# Patient Record
Sex: Female | Born: 1970 | Race: White | Hispanic: No | Marital: Married | Smoking: Former smoker
Health system: Southern US, Community
[De-identification: ages and names within clinical notes are randomized; demographics above are authoritative.]

## PROBLEM LIST (undated history)

## (undated) DIAGNOSIS — F988 Other specified behavioral and emotional disorders with onset usually occurring in childhood and adolescence: Secondary | ICD-10-CM

## (undated) DIAGNOSIS — F329 Major depressive disorder, single episode, unspecified: Secondary | ICD-10-CM

## (undated) DIAGNOSIS — F32A Depression, unspecified: Secondary | ICD-10-CM

## (undated) DIAGNOSIS — J45909 Unspecified asthma, uncomplicated: Secondary | ICD-10-CM

## (undated) DIAGNOSIS — Z8759 Personal history of other complications of pregnancy, childbirth and the puerperium: Secondary | ICD-10-CM

## (undated) DIAGNOSIS — K648 Other hemorrhoids: Secondary | ICD-10-CM

## (undated) HISTORY — PX: FOOT SURGERY: SHX648

---

## 2000-01-20 ENCOUNTER — Other Ambulatory Visit: Admission: RE | Admit: 2000-01-20 | Discharge: 2000-01-20 | Payer: Self-pay | Admitting: Obstetrics and Gynecology

## 2002-10-28 ENCOUNTER — Other Ambulatory Visit: Admission: RE | Admit: 2002-10-28 | Discharge: 2002-10-28 | Payer: Self-pay | Admitting: Obstetrics & Gynecology

## 2003-02-03 ENCOUNTER — Encounter: Payer: Self-pay | Admitting: Obstetrics and Gynecology

## 2003-02-03 ENCOUNTER — Inpatient Hospital Stay (HOSPITAL_COMMUNITY): Admission: AD | Admit: 2003-02-03 | Discharge: 2003-02-06 | Payer: Self-pay | Admitting: Obstetrics & Gynecology

## 2003-02-04 ENCOUNTER — Encounter: Payer: Self-pay | Admitting: Obstetrics and Gynecology

## 2003-02-07 ENCOUNTER — Encounter: Payer: Self-pay | Admitting: Emergency Medicine

## 2003-02-07 ENCOUNTER — Encounter (INDEPENDENT_AMBULATORY_CARE_PROVIDER_SITE_OTHER): Payer: Self-pay | Admitting: Specialist

## 2003-02-07 ENCOUNTER — Encounter: Payer: Self-pay | Admitting: Obstetrics and Gynecology

## 2003-02-07 ENCOUNTER — Inpatient Hospital Stay (HOSPITAL_COMMUNITY): Admission: AD | Admit: 2003-02-07 | Discharge: 2003-02-12 | Payer: Self-pay | Admitting: Obstetrics and Gynecology

## 2003-02-08 ENCOUNTER — Encounter: Payer: Self-pay | Admitting: Obstetrics and Gynecology

## 2003-02-09 ENCOUNTER — Encounter: Payer: Self-pay | Admitting: Obstetrics and Gynecology

## 2003-02-15 ENCOUNTER — Inpatient Hospital Stay (HOSPITAL_COMMUNITY): Admission: AD | Admit: 2003-02-15 | Discharge: 2003-02-15 | Payer: Self-pay | Admitting: Obstetrics and Gynecology

## 2003-06-21 ENCOUNTER — Inpatient Hospital Stay (HOSPITAL_COMMUNITY): Admission: EM | Admit: 2003-06-21 | Discharge: 2003-06-24 | Payer: Self-pay | Admitting: *Deleted

## 2003-06-25 ENCOUNTER — Other Ambulatory Visit (HOSPITAL_COMMUNITY): Admission: RE | Admit: 2003-06-25 | Discharge: 2003-06-27 | Payer: Self-pay | Admitting: Psychiatry

## 2003-10-03 ENCOUNTER — Other Ambulatory Visit: Admission: RE | Admit: 2003-10-03 | Discharge: 2003-10-03 | Payer: Self-pay | Admitting: Obstetrics and Gynecology

## 2004-02-13 ENCOUNTER — Inpatient Hospital Stay (HOSPITAL_COMMUNITY): Admission: AD | Admit: 2004-02-13 | Discharge: 2004-02-13 | Payer: Self-pay | Admitting: Obstetrics and Gynecology

## 2004-03-09 ENCOUNTER — Ambulatory Visit (HOSPITAL_COMMUNITY): Admission: RE | Admit: 2004-03-09 | Discharge: 2004-03-09 | Payer: Self-pay | Admitting: Obstetrics and Gynecology

## 2004-03-09 ENCOUNTER — Ambulatory Visit: Payer: Self-pay | Admitting: *Deleted

## 2004-03-26 ENCOUNTER — Ambulatory Visit (HOSPITAL_COMMUNITY): Admission: RE | Admit: 2004-03-26 | Discharge: 2004-03-26 | Payer: Self-pay | Admitting: Obstetrics and Gynecology

## 2004-04-09 ENCOUNTER — Ambulatory Visit (HOSPITAL_COMMUNITY): Admission: RE | Admit: 2004-04-09 | Discharge: 2004-04-09 | Payer: Self-pay | Admitting: *Deleted

## 2004-04-10 ENCOUNTER — Inpatient Hospital Stay (HOSPITAL_COMMUNITY): Admission: AD | Admit: 2004-04-10 | Discharge: 2004-04-10 | Payer: Self-pay | Admitting: Obstetrics and Gynecology

## 2004-04-13 ENCOUNTER — Ambulatory Visit: Payer: Self-pay | Admitting: *Deleted

## 2004-04-13 ENCOUNTER — Ambulatory Visit (HOSPITAL_COMMUNITY): Admission: RE | Admit: 2004-04-13 | Discharge: 2004-04-13 | Payer: Self-pay | Admitting: *Deleted

## 2004-04-20 ENCOUNTER — Ambulatory Visit: Payer: Self-pay | Admitting: Obstetrics & Gynecology

## 2004-04-20 ENCOUNTER — Ambulatory Visit (HOSPITAL_COMMUNITY): Admission: RE | Admit: 2004-04-20 | Discharge: 2004-04-20 | Payer: Self-pay | Admitting: *Deleted

## 2004-04-27 ENCOUNTER — Ambulatory Visit: Payer: Self-pay | Admitting: Family Medicine

## 2004-04-27 ENCOUNTER — Ambulatory Visit: Payer: Self-pay | Admitting: *Deleted

## 2004-04-27 ENCOUNTER — Ambulatory Visit (HOSPITAL_COMMUNITY): Admission: RE | Admit: 2004-04-27 | Discharge: 2004-04-27 | Payer: Self-pay | Admitting: *Deleted

## 2004-04-29 ENCOUNTER — Inpatient Hospital Stay (HOSPITAL_COMMUNITY): Admission: AD | Admit: 2004-04-29 | Discharge: 2004-05-04 | Payer: Self-pay | Admitting: Obstetrics and Gynecology

## 2004-04-30 ENCOUNTER — Encounter (INDEPENDENT_AMBULATORY_CARE_PROVIDER_SITE_OTHER): Payer: Self-pay | Admitting: *Deleted

## 2004-05-05 ENCOUNTER — Encounter: Admission: RE | Admit: 2004-05-05 | Discharge: 2004-06-04 | Payer: Self-pay | Admitting: Obstetrics and Gynecology

## 2004-07-05 ENCOUNTER — Encounter: Admission: RE | Admit: 2004-07-05 | Discharge: 2004-08-03 | Payer: Self-pay | Admitting: Obstetrics and Gynecology

## 2004-10-11 ENCOUNTER — Other Ambulatory Visit: Admission: RE | Admit: 2004-10-11 | Discharge: 2004-10-11 | Payer: Self-pay | Admitting: Obstetrics and Gynecology

## 2005-02-23 IMAGING — US US OB FOLLOW-UP
1 series · 18 of 28 positions shown · non-contrast
Comparison: none

CLINICAL DATA: 34 week 2 day assigned gestational age. Maternal hypertension. 
Previous history of preeclampsia, abruption, and pregnancy loss. Evaluate fetal
growth, amniotic fluid, biophysical profile and fetal Dopplers.

[Series 1: us ob re-eval · 18 of 53 slices shown]
[im 1/53]
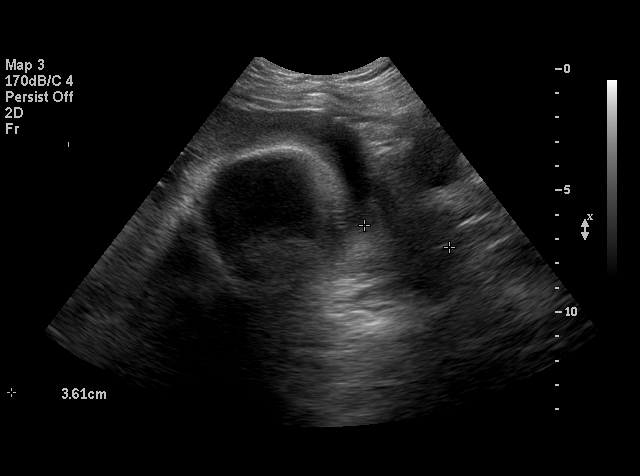
[im 4/53]
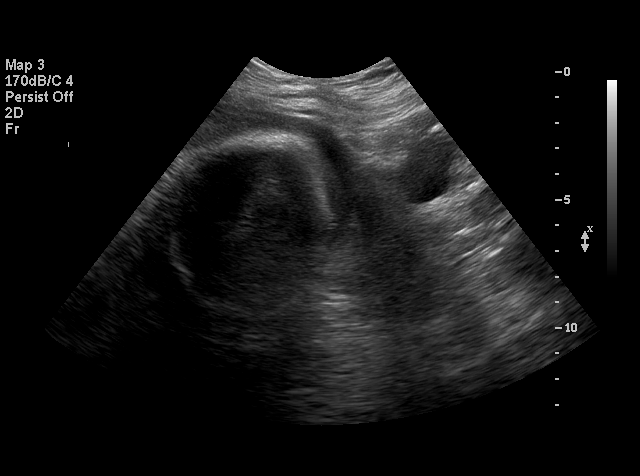
[im 6/53]
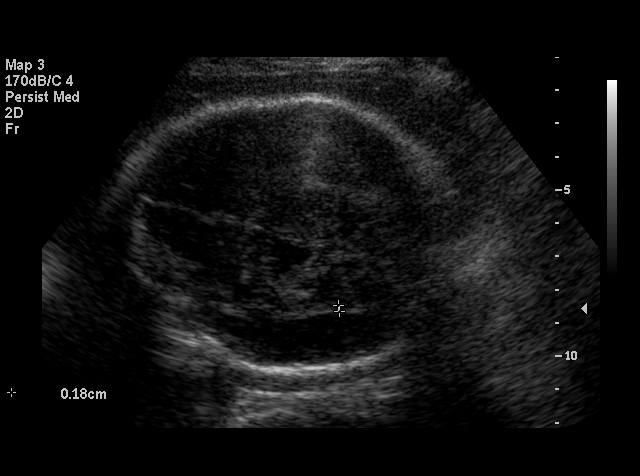
[im 10/53]
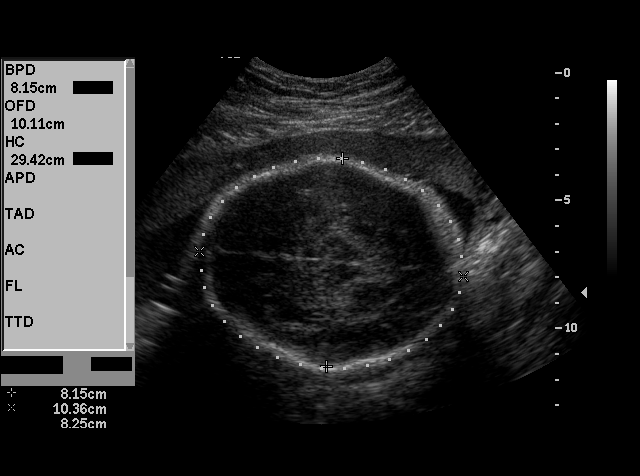
[im 14/53]
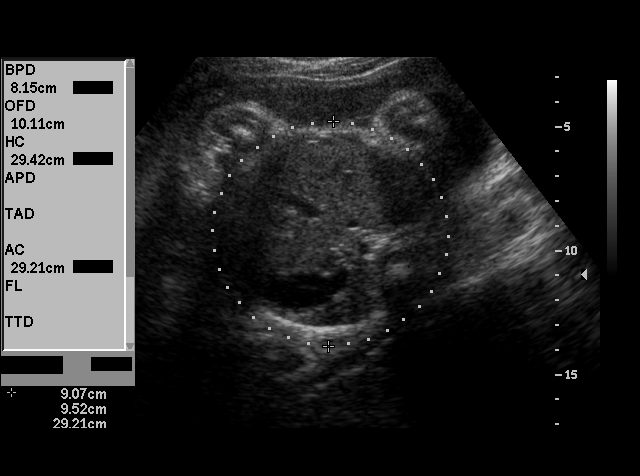
[im 16/53]
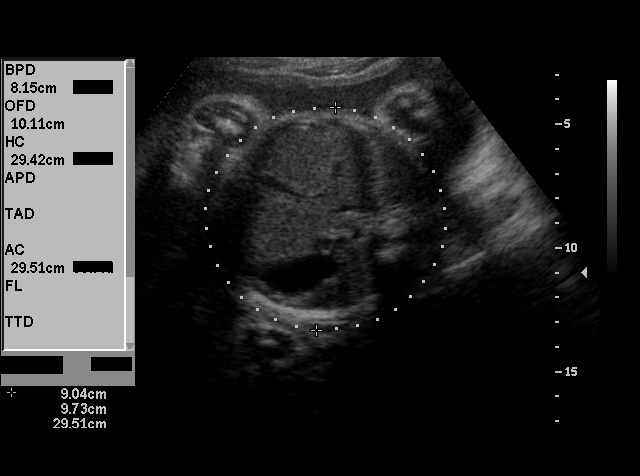
[im 20/53]
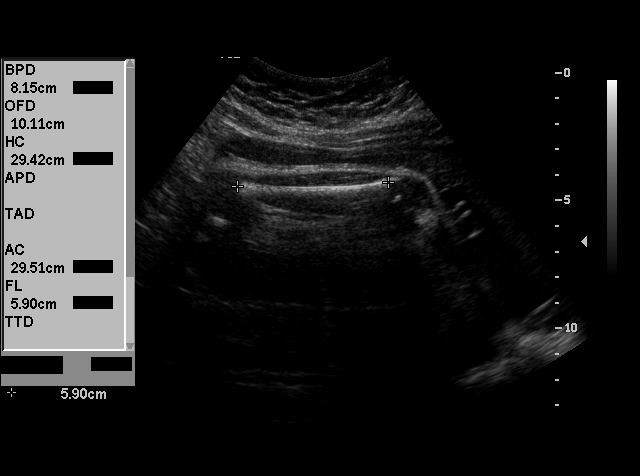
[im 22/53]
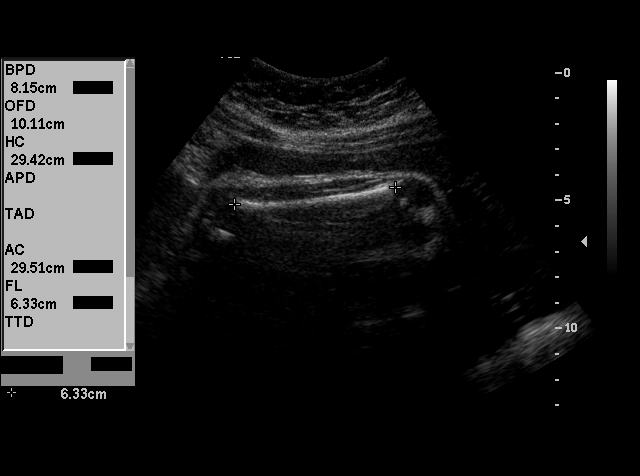
[im 26/53]
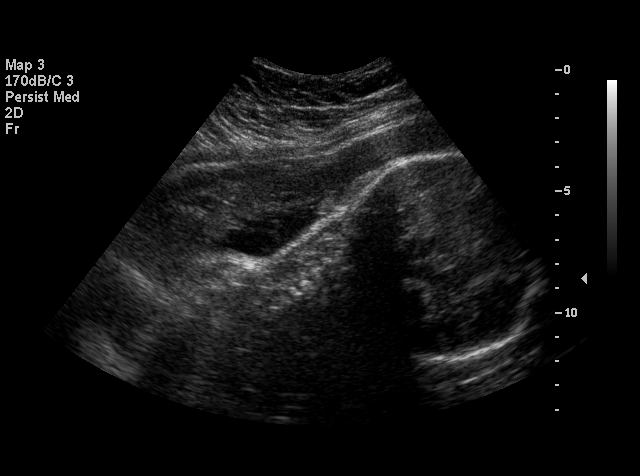
[im 27/53]
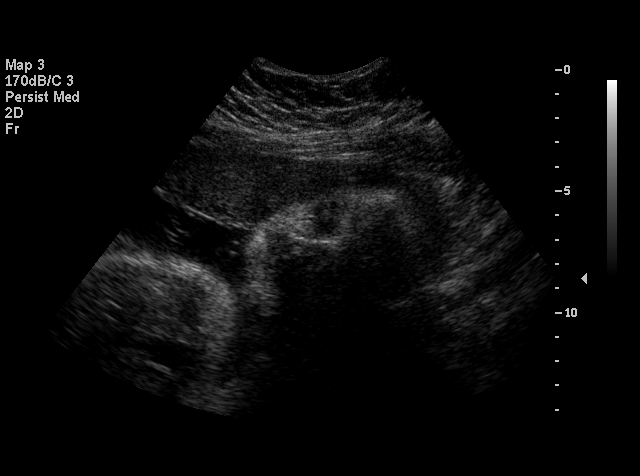
[im 31/53]
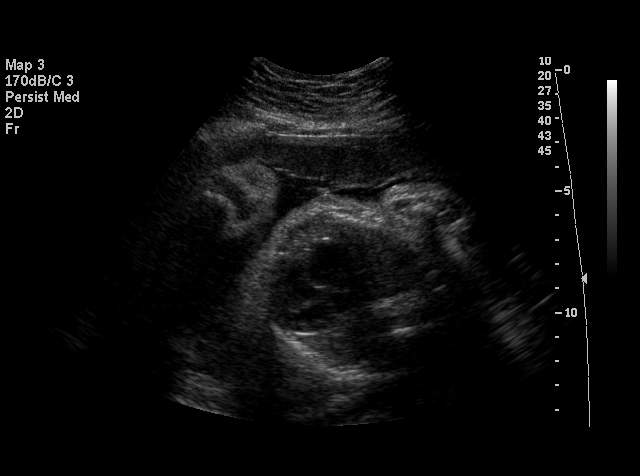
[im 33/53]
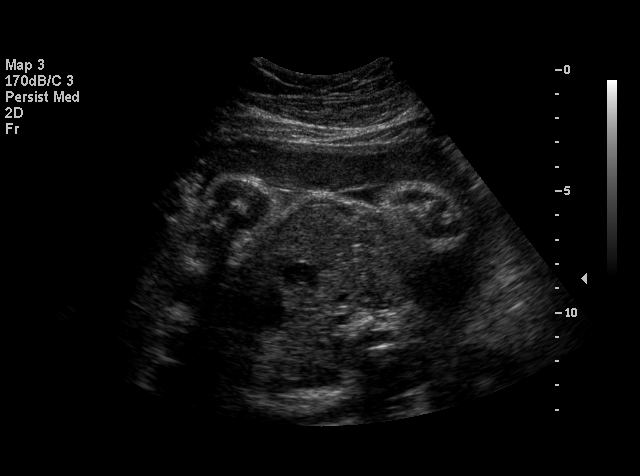
[im 37/53]
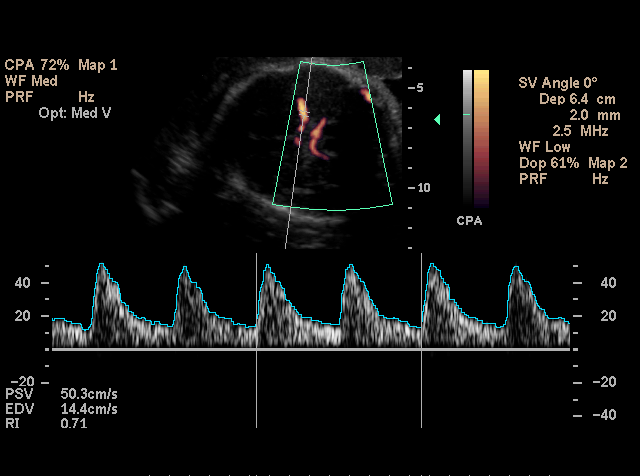
[im 41/53]
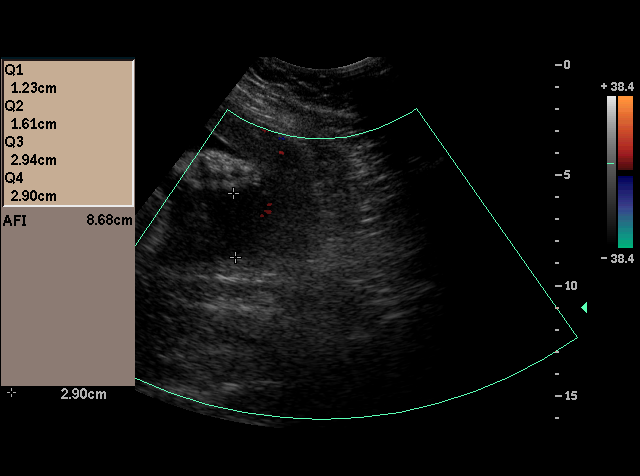
[im 43/53]
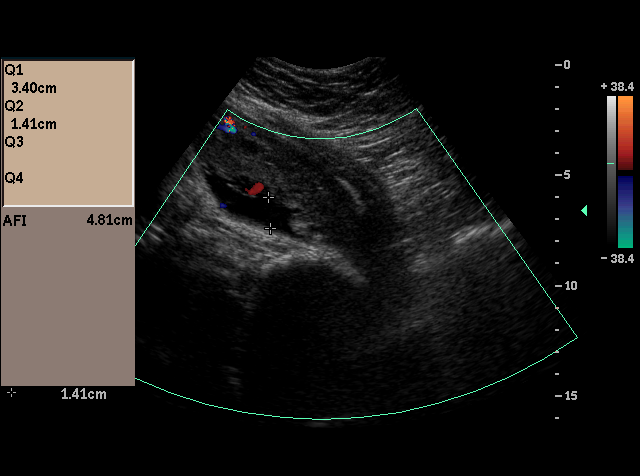
[im 47/53]
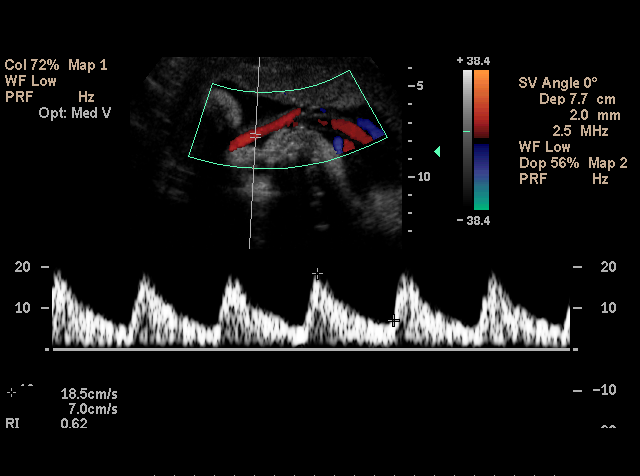
[im 49/53]
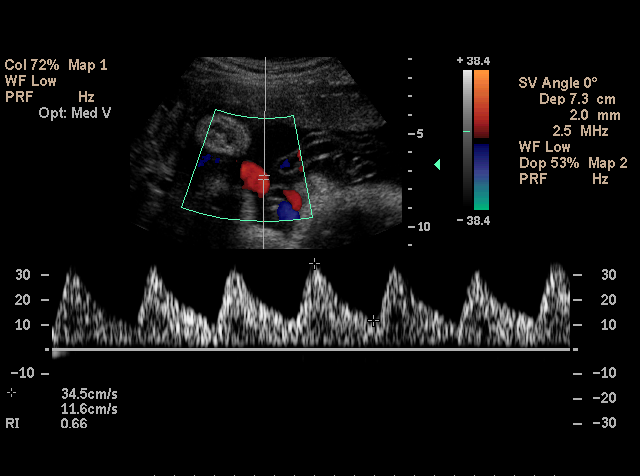
[im 53/53]
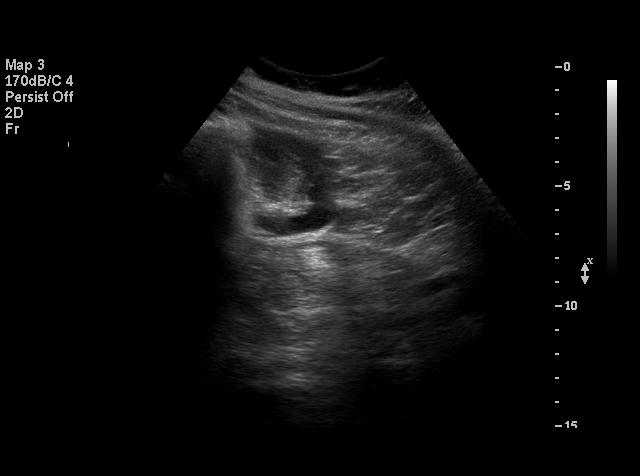

[18 of 28 positions shown; findings below may reference images not displayed]

OBSTETRICAL ULTRASOUND RE-EVALUATION:

Number of fetuses: 1
Heart rate: 136
Movement: Yes
Breathing: Yes
Presentation: Cephalic
Placental Location: Anterior, left lateral 
Placental Grade: II
Placenta Previa: No
Amniotic Fluid (Subjective): Normal
Amniotic Fluid (Objective): 10.4 cm AFI (5th - 95th%ile = 8.1 - 24.8 cm for 34
wks)

BPD: 8.2 cm 33 w 0 d
HC: 29.7 cm 32 w 6 d
AC: 29.4 cm 33 w 3 d
FL: 6.1 cm 31 w 6 d
Mean GA: 32 w 6 d

EFW: 9051 g (H) 25th - 50th%ile (0825-2146g) for 07wks

FETAL ANATOMY
Lateral Ventricle: Visualized 
Thalamus/CSP: Visualized 
Posterior Fossa: Previously seen 
Nuchal Region: N/A
Spine: Previously seen 
4-Chamber Heart: Previously seen 
Stomach: Visualized 
3-Vessel Cord: Previously seen 
Abd Cord Insert: Not visualized 
Kidneys: Visualized 
Bladder: Visualized 
Extremities: Previously seen 

Additional Anatomy Visualized: Upper lip, profile, and diaphragm.

MATERNAL FINDINGS
Cervix: 3.3 cm Transabdominally
IMPRESSION: 1. Assigned gestational age is currently 34 weeks 2 days. Appropriate fetal
growth, with EFW at 25th - 50th percentile.

2. Normal amniotic fluid volume. 

BIOPHYSICAL PROFILE

Movement: 2 Time: 10 minutes
Tone: 2
Breathing: 2
Amniotic Fluid: 2

Total Score: 8

IMPRESSION

Biophysical profile score [DATE].

DOPPLER ULTRASOUND OF FETUS

Umbilical Artery S/D Ratio: 3.00 (NL < 3.57)

Middle Cerebral Artery PI: 1.49 (NL > 1.35)

IMPRESSION

Both umbilical artery and fetal middle cerebral artery Doppler measurements are
within normal limits.

## 2005-07-29 ENCOUNTER — Encounter: Admission: RE | Admit: 2005-07-29 | Discharge: 2005-07-29 | Payer: Self-pay | Admitting: Family Medicine

## 2005-09-23 ENCOUNTER — Ambulatory Visit (HOSPITAL_COMMUNITY): Admission: RE | Admit: 2005-09-23 | Discharge: 2005-09-23 | Payer: Self-pay | Admitting: Family Medicine

## 2005-11-27 ENCOUNTER — Emergency Department (HOSPITAL_COMMUNITY): Admission: EM | Admit: 2005-11-27 | Discharge: 2005-11-27 | Payer: Self-pay | Admitting: Emergency Medicine

## 2005-12-29 ENCOUNTER — Encounter: Admission: RE | Admit: 2005-12-29 | Discharge: 2005-12-29 | Payer: Self-pay | Admitting: General Surgery

## 2005-12-31 ENCOUNTER — Emergency Department (HOSPITAL_COMMUNITY): Admission: EM | Admit: 2005-12-31 | Discharge: 2005-12-31 | Payer: Self-pay | Admitting: Emergency Medicine

## 2006-01-02 ENCOUNTER — Ambulatory Visit (HOSPITAL_COMMUNITY): Admission: RE | Admit: 2006-01-02 | Discharge: 2006-01-03 | Payer: Self-pay | Admitting: Surgery

## 2006-01-02 ENCOUNTER — Encounter (INDEPENDENT_AMBULATORY_CARE_PROVIDER_SITE_OTHER): Payer: Self-pay | Admitting: *Deleted

## 2006-02-20 ENCOUNTER — Ambulatory Visit: Payer: Self-pay | Admitting: Gastroenterology

## 2006-03-23 ENCOUNTER — Ambulatory Visit: Payer: Self-pay | Admitting: Gastroenterology

## 2007-06-25 ENCOUNTER — Other Ambulatory Visit: Admission: RE | Admit: 2007-06-25 | Discharge: 2007-06-25 | Payer: Self-pay | Admitting: Family Medicine

## 2007-11-07 ENCOUNTER — Encounter: Admission: RE | Admit: 2007-11-07 | Discharge: 2007-11-07 | Payer: Self-pay | Admitting: Otolaryngology

## 2007-11-29 ENCOUNTER — Emergency Department (HOSPITAL_BASED_OUTPATIENT_CLINIC_OR_DEPARTMENT_OTHER): Admission: EM | Admit: 2007-11-29 | Discharge: 2007-11-29 | Payer: Self-pay | Admitting: Emergency Medicine

## 2007-12-13 ENCOUNTER — Emergency Department (HOSPITAL_COMMUNITY): Admission: EM | Admit: 2007-12-13 | Discharge: 2007-12-14 | Payer: Self-pay | Admitting: *Deleted

## 2007-12-14 ENCOUNTER — Emergency Department (HOSPITAL_COMMUNITY): Admission: EM | Admit: 2007-12-14 | Discharge: 2007-12-14 | Payer: Self-pay | Admitting: Emergency Medicine

## 2008-04-13 ENCOUNTER — Emergency Department (HOSPITAL_BASED_OUTPATIENT_CLINIC_OR_DEPARTMENT_OTHER): Admission: EM | Admit: 2008-04-13 | Discharge: 2008-04-14 | Payer: Self-pay | Admitting: Emergency Medicine

## 2008-04-27 ENCOUNTER — Emergency Department (HOSPITAL_BASED_OUTPATIENT_CLINIC_OR_DEPARTMENT_OTHER): Admission: EM | Admit: 2008-04-27 | Discharge: 2008-04-27 | Payer: Self-pay | Admitting: Emergency Medicine

## 2008-07-21 ENCOUNTER — Ambulatory Visit: Payer: Self-pay | Admitting: Radiology

## 2008-07-21 ENCOUNTER — Emergency Department (HOSPITAL_BASED_OUTPATIENT_CLINIC_OR_DEPARTMENT_OTHER): Admission: EM | Admit: 2008-07-21 | Discharge: 2008-07-21 | Payer: Self-pay | Admitting: Emergency Medicine

## 2008-11-12 ENCOUNTER — Other Ambulatory Visit: Admission: RE | Admit: 2008-11-12 | Discharge: 2008-11-12 | Payer: Self-pay | Admitting: Family Medicine

## 2009-07-07 ENCOUNTER — Emergency Department (HOSPITAL_COMMUNITY): Admission: EM | Admit: 2009-07-07 | Discharge: 2009-07-07 | Payer: Self-pay | Admitting: Emergency Medicine

## 2010-07-03 ENCOUNTER — Encounter: Payer: Self-pay | Admitting: *Deleted

## 2010-07-03 ENCOUNTER — Encounter: Payer: Self-pay | Admitting: Obstetrics and Gynecology

## 2010-08-29 LAB — URINE CULTURE
Colony Count: NO GROWTH
Culture: NO GROWTH

## 2010-08-29 LAB — RAPID URINE DRUG SCREEN, HOSP PERFORMED
Amphetamines: POSITIVE — AB
Barbiturates: NOT DETECTED
Opiates: POSITIVE — AB

## 2010-08-29 LAB — DIFFERENTIAL
Basophils Relative: 1 % (ref 0–1)
Eosinophils Absolute: 0.1 10*3/uL (ref 0.0–0.7)
Monocytes Absolute: 0.5 10*3/uL (ref 0.1–1.0)

## 2010-08-29 LAB — URINALYSIS, ROUTINE W REFLEX MICROSCOPIC
Glucose, UA: NEGATIVE mg/dL
Ketones, ur: 40 mg/dL — AB
Protein, ur: 100 mg/dL — AB
Specific Gravity, Urine: 1.039 — ABNORMAL HIGH (ref 1.005–1.030)
pH: 5.5 (ref 5.0–8.0)

## 2010-08-29 LAB — CK TOTAL AND CKMB (NOT AT ARMC): CK, MB: 7.8 ng/mL (ref 0.3–4.0)

## 2010-08-29 LAB — BASIC METABOLIC PANEL
BUN: 13 mg/dL (ref 6–23)
Calcium: 8.9 mg/dL (ref 8.4–10.5)
Chloride: 107 mEq/L (ref 96–112)
Creatinine, Ser: 0.87 mg/dL (ref 0.4–1.2)
GFR calc Af Amer: >60 mL/min (ref >60)
Glucose, Bld: 87 mg/dL (ref 70–99)
Potassium: 3 mEq/L — ABNORMAL LOW (ref 3.5–5.1)

## 2010-08-29 LAB — CBC
HCT: 39.3 % (ref 36.0–46.0)
Hemoglobin: 13.4 g/dL (ref 12.0–15.0)
MCV: 90.2 fL (ref 78.0–100.0)
RBC: 4.36 MIL/uL (ref 3.87–5.11)

## 2010-08-29 LAB — POCT CARDIAC MARKERS
CKMB, poc: 5.7 ng/mL (ref 1.0–8.0)
Troponin i, poc: <0.05 ng/mL (ref 0.00–0.09)

## 2010-08-29 LAB — URINE MICROSCOPIC-ADD ON

## 2010-08-29 LAB — SALICYLATE LEVEL: Salicylate Lvl: <4 mg/dL (ref 2.8–20.0)

## 2010-10-29 NOTE — Discharge Summary (Signed)
NAME:  Tracey Ward, Tracey Ward                  ACCOUNT NO.:  [id number]   MEDICAL RECORD NO.:  [id number]                   PATIENT TYPE:  INP   LOCATION:  9141                                 FACILITY:  [facility]   PHYSICIAN:  Randye Lobo, M.D.                DATE OF BIRTH:  1971/06/05   DATE OF ADMISSION:  02/07/2003  DATE OF DISCHARGE:  02/12/2003                                 DISCHARGE SUMMARY   ADMISSION DIAGNOSES:  1. Intrauterine fetal demise at 23+5 weeks.  2. Placental abruption.  3. Oligohydramnios.  4. Probable severe pre-eclampsia with acute pulmonary edema.  5. Tobacco use.  6. Anxiety and depression disorder.   DISCHARGE DIAGNOSES:  1. Probable severe pre-eclampsia with acute pulmonary edema, resolving.  2. Tobacco use.  3. 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  4. Status post hysterotomy.   PROCEDURE:  Hysterotomy with delivery of expired fetus, on February 07, 2003,  at the [facility], under the direction of Dr. Conley Simmonds.   HOSPITAL COURSE:  The patient was a 40 year old gravida 1, para 0, Caucasian  female who presented to [facility] at 23+[redacted] weeks gestation, on the  evening of February 07, 2003, with acute shortness of breath and vaginal  bleeding.  The patient had been recently discharged from the [facility]  where she was evaluated for pregnancy induced hypertension, a partial  placental abruption, oligohydramnios, and an upper respiratory infection  with asthma exacerbation.  Upon presentation to [facility], the  patient was cared for by the emergency department staff and was diagnosed  with acute respiratory distress secondary to pulmonary edema.  The patient's  blood pressure was also noted to be elevated and pre-eclamptic labs were,  therefore, drawn.  Of note, the SGOT measured 42 and the SGPT measured 48.  The uric acid was 5.1.  The rest of the pre-eclamptic labs were  unremarkable.   The patient did have a negative CK-MB and troponin.   Initially fetal heart tones were present, measured by the emergency  department staff at 140 beats  per minute.  When an attempt was made to re  document fetal heart tones, these could not be detected with an external  doppler, and a STAT OB ultrasound was, therefore, requested which documented  a fetal demise.  In addition, oligohydramnios was appreciated and a marginal  hemorrhage at the lower border of the placenta near the internal os.  The  patient was diagnosed with a probable severe and atypical pre-eclampsia with  acute pulmonary edema and a fetal demise and plans were made to transfer the  patient to the [facility] for the remainder of her care.   Of note, the patient suffered from a significant anxiety disorder and  depression for which she was treated throughout the pregnancy with  Wellbutrin, Lexapro and regular psychiatric care.  A discussion was held  with the patient and her  husband regarding induction of labor versus a  hysterotomy.  The patient reported significant anxiety with the idea of  induction of labor and did not feel that mentally she could handle this and  she, therefore, requested to proceed with hysterotomy, knowing that this  would necessitate cesarean section for any future deliveries.  Risks and  benefits of each option were reviewed with the patient and her husband, and  the decision was made to proceed with surgery as the patient had a stable  respiratory status at this time, but it was also unclear if her pre-  eclamptic laboratory studies would become significantly and acutely abnormal  given the severity of onset of her presentation.   At the [facility], the patient had an anesthesia consultation in the  maternity admissions unit, and she was then taken to the operating room  where she underwent a hysterotomy with a classical incision of the uterus  and delivery of the expired fetus  which was female.  The Apgar's were 0 and 0.  The weight of the fetus was later noted to be 430 grams.  The placenta,  itself, was anterior along the uterine wall and needed to be traversed to  enter the uterine cavity.  The placenta was sent for pathology for  evaluation; however, this did return documenting just disruption of the  maternal surface and no other information.  The amniotic fluid did appear to  be clear at the time of the delivery.   Postoperatively, the patient was managed in the adult intensive care unit.  Her pulmonary edema was treated aggressively with intravenous Lasix and her  urine output and hemodynamic status were monitored carefully.  Her blood  pressure was elevated in the 140-160/70-90 range, and she was noted to be  tachycardic.  Labetalol was, therefore, prescribed orally.  The patient's  blood pressure did normalize with the Lasix and the Labetalol treatment.  The patient did have good urine output and she had progressive improvement  of her respiratory status and she was able to be weaned off of the oxygen  and onto room air.  A chest x-ray, on February 09, 2003, documented near  complete resolution of the pulmonary edema with a small right pleural  effusion.   With respect to the patient's remaining picture of pre-eclampsia, her  laboratory studies were followed throughout her hospitalization and were  unremarkable.  Her liver function study did return to normal.  The patient  did have a hemoglobin of 8.4 at the time of her discharge.  The patient did  have tremendous third spacing of fluid.  She did have a 20 pound weight gain  which was documented from a preoperative weight to postoperative day number  one.  The patient developed leakage from the epidural site which was thought  to be consistent with third spacing of fluid and not cerebrospinal fluid. She developed tense lower extremity edema and hyperreflexia of the patella  reflexes with clonus  bilaterally.  The patient even had staples removed and  then had a separation of the right apex of the wound with drainage of what I  understand to be only serous fluid which was not bloody in nature.  Again, I  attribute this to third spacing of fluid in an area which was a potential  space.  The fascia was examined and noted to be intact by the on call  physician, and the incision was actually left open in this region with a  plan for  careful followup in the office.  There did not appear to be  infection present.  Of note, the patient did not receive magnesium sulfate  during her hospitalization due to the nature of her pulmonary edema.   With respect to the patient's anxiety and depression, she was restarted on  Lexapro and Effexor during the hospitalization.  In addition, she was  treated with Valium 5 mg p.o. t.i.d. p.r.n.  An in-hospital psychiatry  consult was requested.  The patient was found to be stable with a diagnosis  of a generalized anxiety disorder and depression in addition to bereavement.  She was assessed as not a danger to herself.  As she had an established  psychiatrist in [facility], a plan was made for the patient to followup with  her psychiatrist, Dr. Isaac Bliss, as an outpatient.  The patient preferred not  to make any changes in her current medication.   The remainder of the patient's postoperative care was unremarkable.  She was  able to ambulate independently, tolerate a regular diet, void spontaneously,  and have adequate pain control at the time of her discharge.   INSTRUCTIONS AT DISCHARGE:  1. Discharged to home.  2. The patient will have decreased activity for the next six weeks and until     the incision is completely healed.  3. The patient will take the following medications:     a. Percocet 1-2 p.o. q.4-6h. p.r.n.     b. Ibuprofen 600 mg p.o. q.6h. p.r.n.     c. Labetalol 100 mg p.o. b.i.d.     d. Lasix 20 mg p.o. b.i.d.     e. Lexapro 10 mg p.o.  every day.     f. Effexor one p.o. every day.     g. Valium 5 mg p.o. t.i.d. p.r.n.     h. Iron sulfate one p.o. t.i.d.        a. Prenatal vitamin one p.o. every day.  4. The patient will followup with [facility] for an incision check     in two days, and she will see Dr. Isaac Bliss postoperatively in his office     as well.  5. The patient will call if she experiences any reoccurrence of shortness of     breath, increased bleeding from the vagina, increased pain, increased     wound separation, drainage, or redness, nausea and vomiting, or any other     concern.                                               Randye Lobo, M.D.    BES/MEDQ  D:  03/05/2003  T:  03/06/2003  Job:  [id number]

## 2010-10-29 NOTE — Op Note (Signed)
Tracey Ward, Tracey Ward                 ACCOUNT NO.:  [id number]   MEDICAL RECORD NO.:  [id number]          PATIENT TYPE:  INP   LOCATION:  5506                         FACILITY:  [facility]   PHYSICIAN:  Adolph Pollack, M.D.DATE OF BIRTH:  Apr 08, 1971   DATE OF PROCEDURE:  01/02/2006  DATE OF DISCHARGE:                                 OPERATIVE REPORT   This job has already been transcribed (Job [id number]).      Adolph Pollack, M.D.  Electronically Signed     TJR/MEDQ  D:  01/02/2006  T:  01/03/2006  Job:  [id number]   cc:   Everardo All. Madilyn Fireman, M.D.

## 2010-10-29 NOTE — Op Note (Signed)
NAME:  Tracey Ward, Tracey Ward                  ACCOUNT NO.:  [id number]   MEDICAL RECORD NO.:  [id number]                   PATIENT TYPE:  INP   LOCATION:  9374                                 FACILITY:  [facility]   PHYSICIAN:  Randye Lobo, M.D.                DATE OF BIRTH:  April 28, 1971   DATE OF PROCEDURE:  02/08/2003  DATE OF DISCHARGE:                                 OPERATIVE REPORT   PREOPERATIVE DIAGNOSES:  1. Intrauterine fetal demise at 23 plus 6 weeks.  2. Severe preeclampsia with pulmonary edema.  3. Placental abruptions.  4. Oligohydramnios.  5. Tobacco use.   POSTOPERATIVE DIAGNOSES:  1. Intrauterine fetal demise at 23 plus 6 weeks.  2. Severe preeclampsia with pulmonary edema.  3. Placental abruption.  4. Oligohydramnios.  5. Tobacco use.   PROCEDURE:  Hysterotomy with delivery of expired fetus.   SURGEON:  Randye Lobo, M.D.   ANESTHESIA:  Attempted spinal with conversion to epidural by Dr. Pamalee Leyden.   IV FLUIDS:  900 cc Ringer's lactate.   ESTIMATED BLOOD LOSS:  900 cc.   URINE OUTPUT:  350 cc.   COMPLICATIONS:  None.   INDICATIONS FOR PROCEDURE:  The patient is a 40 year old gravida 1, para 0,  Caucasian female who presented to [facility] at 23 plus [redacted] weeks  gestation on the evening of 02/07/03 with shortness of breath and vaginal  spotting.  The patient had been recently discharged from the [facility] on 02/06/03 after she had been evaluated for pregnancy-induced  hypertension, a partial placental abruption, low amniotic fluid, and an  upper respiratory infection with an asthma exacerbation.   At the [facility], the patient's blood pressure was monitored.  She  did not receive treatment for hypertension.  She had unremarkable  preeclamptic laboratories.  The evening of her admission to [facility], the patient reported headache and difficulty seeing.  The patient  had reported some swelling during her previous  hospitalization after she was  treated with IV fluids.   The patient had brownish vaginal discharge prior to her admission to [facility] four days ago.  An ultrasound showed a marginal hemorrhage between  the lower placental margin and the internal os measuring 2.3 x 0.9 x 3.4 cm  in the largest diameter.  The amniotic fluid pocket which was visible was  2.9 cm and was thought to be low.  A follow-up ultrasound performed the day  after the patient's admission showed a slightly improved fluid level after  IV hydration.  The patient did have intermittent fetal monitoring at the  [facility] which was reassuring, and she reported having fetal  movement earlier in the day prior to her presentation to [facility].   The patient's respiratory symptoms of shortness of breath and dry cough for  which she had been evaluated at the [facility] were treated with an  albuterol nebulizer and  then a metered dose inhaler.  Initially, the patient  was treated with Ceftin orally.  A chest x-ray was negative for an  infiltrate or an acute process.  The patient was treated with Solu-Medrol  intravenously and was then discharged on oral prednisone, a Z-Pak and a  Combivent inhaler.  When the patient presented to [facility], she  was acutely short of breath with a moist cough and felt difficulty with  exchanging air.   The patient was seen and evaluated by the emergency department staff.  Her  blood pressure initially was sporadically elevated but then became  persistently elevated in the range of 150s to 170s over 80s to 106.  Fetal  heart tones were documented at 140 by the emergency room staff.  The patient  on chest x-ray, was noted to have pulmonary edema.  She was treated with IV  Lasix and quickly defervesced and actually had discontinuation of her oxygen  and normalization of her O2 saturations after the diuresis was well  underway.  Preeclamptic laboratories  were requested, and the SGOT was noted  to be 42 with the SGPT at 48.  The uric acid was 5.1.  The remainder of the  preeclamptic laboratories were unremarkable.  The patient did have a CK MB  which was 3.6 and a troponin which was 0.03.   An attempt was made to document fetal heart tones again prior to  transferring the patient to the [facility].  An external Doppler could  not detect fetal heart tones and an OB ultrasound was therefore requested.  A fetal demise was confirmed at this time.  The amniotic fluid again looked  to be low and there was a marginal hemorrhage still present on the lower  placental border next to the internal os.   The patient was diagnosed with probable severe preeclampsia with pulmonary  edema and an intrauterine fetal demise and placental abruption with  oligohydramnios.  The patient's antenatal course had also been significant  for tobacco use.  Of note, the patient earlier in the pregnancy had an  abnormal AFP with an increased risk of Down's Syndrome.  The patient did  have a normal genetic amniocentesis with a 65 XY result.  The patient also  had a normal ultrasound earlier in the pregnancy.   A discussion was held with the patient and her husband regarding her  diagnosis and options for delivery including induction of labor versus  hysterotomy.  The patient reported significant anxiety with the idea of  induction of labor.  The patient's cervix was also noted to be closed and  thick due to her diagnosis of severe preeclampsia with resolving pulmonary  edema, a decision was made to proceed with hysterotomy after the risks,  benefits and alternatives were clearly outlined.  The patient understood  that hysterotomy at this premature gestational age would mandate repeat  cesarean section in the future and avoidance of labor.  She and her husband  expressed and understanding.  FINDINGS:  A nonviable female was delivered at 12:07 a.m.  The Apgars were  0  at one minute and 0 at five minutes.  No gross abnormalities were  appreciated.  The placenta was anterior with an adherent clot.  The uterus,  tubes and ovaries were otherwise unremarkable.   SPECIMENS:  The placenta was sent to pathology.   DESCRIPTION OF PROCEDURE:  After the patient was [facility] to the [facility] Triage Department, preparations were  made  to proceed with surgery.  The patient was then brought down to the operating  room and an attempt was made to perform a spinal anesthetic which was not  adequate and therefore, converted over to an epidural.   The patient was then placed in the supine position and the abdomen was  sterilely prepped and a Foley catheter was sterilely placed inside the  bladder.  The patient was then sterilely draped.  A Pfannenstiel incision  was created sharply with a scalpel.  This was carried down to the fascia  with monopolar cautery.  The fascia was then incised in the midline with the  scalpel and the incision was carried out bilaterally with the Mayo scissors.  The rectus muscles were dissected off of the overlying fascia using sharp  dissection superiorly and inferiorly.  The rectus muscles were then divided  sharply in the midline and the parietal peritoneum was grasped and elevated  with two hemostat clamps.  It was entered sharply.  The incision of the  peritoneum  was extended cranial and caudally with care taken to avoid  cystotomy and enterotomy.   The uterus was identified and the lower uterine segment was noted to be too  small to perform a transverse lower uterine segment incision as was  expected.  A classical uterine incision was created with a scalpel.  The  placenta was noted to be anterior and had to be traversed to gain access to  the fetal membranes which were ruptured and clear fluid was noted.  The  fetus was then delivered without difficulty and the cord was doubly clamped  and cut.   The fetus was nonviable.  The placenta had been extracted and was  sent to pathology.  The fetus was carried over to the warming table at this  time.   The uterine incision was exposed, and the incision was closed in a  multilayered closure.  The first layer was a running locked layer of #1  chromic.  The second layer was an embrocating layer of #1 chromic, and the  third layer was a subcuticular closure of the serosa with 3-0 Vicryl.  There  was some additional bleeding noted at the inferior apex of the incision and  in the mid portion of the incision which responded well to figure-of-eight  sutures of #1 chromic.  This provided excellent hemostasis.  There was one  additional location up toward the superior apex that also responded well to  figure-of-eight suture of #1 chromic.   The peritoneal cavity was irrigated and suctioned and the incision was re- examined and found to be hemostatic and the abdomen was therefore closed.  The peritoneum was closed with a running suture of 3-0.  The rectus muscles  were re-approximated in the midline using figure-of-eight sutures of #1  chromic.  The fascia was closed with a running suture of 0 Vicryl.  The  subcutaneous tissue was irrigated and suctioned and made hemostatic with  monopolar cautery.  The staples were used to close the skin.  A sterile  bandage was placed over the incision.   The patient was cleansed of any remaining Betadine.  She was taken to the  recovery room in stable and awake condition.  There were no complications to  the procedure.  All needle, instrument and sponge counts were correct.  Randye Lobo, M.D.    BES/MEDQ  D:  02/08/2003  T:  02/08/2003  Job:  [id number]

## 2010-10-29 NOTE — Op Note (Signed)
NAMEASIANNA, Tracey Ward                 ACCOUNT NO.:  [id number]   MEDICAL RECORD NO.:  [id number]          PATIENT TYPE:  INP   LOCATION:  5506                         FACILITY:  [facility]   PHYSICIAN:  Adolph Pollack, M.D.DATE OF BIRTH:  [birth year]   DATE OF PROCEDURE:  01/02/2006  DATE OF DISCHARGE:                                 OPERATIVE REPORT   PREOPERATIVE DIAGNOSIS:  Thrombosed left-sided hemorrhoid.   POSTOPERATIVE DIAGNOSIS:  Thrombosed left-sided hemorrhoid.   PROCEDURE:  Left hemorrhoidectomy (internal-external).   SURGEON:  Adolph Pollack, M.D.   ANESTHESIA:  General.   INDICATIONS:  This 40 year old female has been having problems for the past  4 weeks with painful and thrombosed external hemorrhoids.  She does have  chronic constipation and has to strain to have BMs.  She had a last excision  12/31/2005 and she had a new occurrence of left-sided swelling and pain was  seen by Dr. Luisa Hart in the office.  However, she refused incision and  drainage of the hemorrhoid there and thus was has been sent to the hospital  to have this performed under general anesthesia.  She and I have discussed  the procedure and the risks including but not limited to bleeding,  infection, wound healing problems, anesthesia, and potential recurrence of  hemorrhoids.   TECHNIQUE:  She is seen in the holding area and brought to the operating  room, placed supine on the operating table and a general anesthetic was  administered.  She was then carefully placed in the lithotomy position.  The  perianal area was sterilely prepped and draped.  The left-sided hemorrhoids  identified, grasped and retracted.  Using sharp dissection I excised as  there was both an internal and external component and I sharply excised the  hemorrhoid and released clot.  I sent this to pathology.  Following this, I  applied direct pressure to the wound and used electrocautery to control  bleeding points.  I then  performed a perianal block using 0.5% Marcaine with  epinephrine.  A piece of Gelfoam was applied followed by a bulky dressing.   She tolerated procedure without apparent complications was taken to recovery  in satisfactory condition.  Given the lateness of the hour (10:40 p.m.) I  believe she is going to need to be kept overnight for observation.      Adolph Pollack, M.D.  Electronically Signed     TJR/MEDQ  D:  01/02/2006  T:  01/03/2006  Job:  [id number]   cc:   Everardo All. Madilyn Fireman, M.D.

## 2010-10-29 NOTE — Discharge Summary (Signed)
NAME:  Tracey Ward, Tracey Ward                  ACCOUNT NO.:  [id number]   MEDICAL RECORD NO.:  [id number]                   PATIENT TYPE:  IPS   LOCATION:  0505                                 FACILITY:  [facility]   PHYSICIAN:  Geoffery Lyons, M.D.                   DATE OF BIRTH:  1970/08/26   DATE OF ADMISSION:  06/21/2003  DATE OF DISCHARGE:  06/24/2003                                 DISCHARGE SUMMARY   CHIEF COMPLAINT AND PRESENT ILLNESS:  This was the first admission to [facility] for this 40 year old white female, divorced,  voluntarily admitted.  History of anxiety and depression.  Suffered fetal  death and toxicemia in 02-14-2003 at six months pregnant.  Difficult in  the holiday season as the baby was due on May 27, 2003.  There was  rapid thoughts, decreased concentration, decreased sleep, alternating with  anhedonia.  Some obsessive thinking episodes, suicidal ideation with no  plan.  Referred by her psychiatrist who was concerned about her mood swings.  Episodes of anxiety and agitation.   PAST FAMILY PSYCHIATRIC HISTORY:  First time at [facility].  Follows  up with Dr. Milagros Evener.   ALCOHOL/DRUG HISTORY:  Prior history of substance abuse in the past.  None  current.   PAST MEDICAL HISTORY:  Noncontributory.   MEDICATIONS:  Effexor XR 150 mg daily, Valium 10 mg as needed for anxiety.   PHYSICAL EXAMINATION:  Performed and failed to show any acute findings.   MENTAL STATUS EXAM:  Fully alert female with appropriate grooming, composed.  Good eye contact.  Constricted affect.  Normal motor.  Normal speech.  Mood  somewhat labile, irritable, depressed.  Thought processes with suicidal  ideation without a plan.  No delusions.  No hallucinations.  Cognition well-  preserved.   ADMISSION DIAGNOSES:   AXIS I:  Mood disorder not otherwise specified.   AXIS II:  No diagnosis.   AXIS III:  No diagnosis.   AXIS IV:  Moderate.   AXIS V:   Global Assessment of Functioning upon admission 39; highest Global  Assessment of Functioning in the last year 69.   LABORATORY DATA:  CBC was within normal limits.  Blood chemistry within  normal limits.  Thyroid profile within normal limits.   HOSPITAL COURSE:  She was admitted and started intensive individual and  group psychotherapy.  She was given Ambien for sleep and given Valium as  needed for anxiety.  Was placed on Effexor XR 150 mg daily.  She was given  Sonata for sleep.  Effexor was increased to 225 mg in the morning and she  was given Trileptal 150 mg twice a day and 300 mg at night.  She was able to  start opening up about everything that went on with her life, with the  losses, the death of her baby, difficult pregnancy.  Claimed increased  anxiety attack.  He apparently did  better on Ativan and wanted to give this  a try.  As the hospitalization progressed, she started feeling better.  They  felt that she was able to open up and do some grief work and it was very  helpful.  On June 24, 2003, she was in full contact with reality.  There  were no suicidal ideation.  No homicidal ideation.  She felt that she was  going to do better outside of the hospital.  It was going to be safe.  Was  willing to come to IOP for further stabilization.  As she was in full  contact with reality, there no suicidal or homicidal ideation.  We went  ahead and discharged to outpatient follow-up.   DISCHARGE DIAGNOSES:   AXIS I:  Mood disorder not otherwise specified.   AXIS II:  No diagnosis.   AXIS III:  No diagnosis.   AXIS IV:  Moderate.   AXIS V:  Global Assessment of Functioning upon discharge 50.   DISCHARGE MEDICATIONS:  1. Effexor XR 225 mg daily.  2. Trileptal 150 mg twice a day and 2 at night.  3. Valium 10 mg every six hours as needed.  4. Ambien 10 mg for sleep.   FOLLOW UP:  [facility].                                               Geoffery Lyons, M.D.    IL/MEDQ  D:  07/30/2003  T:  07/31/2003  Job:  [id number]

## 2010-10-29 NOTE — Assessment & Plan Note (Signed)
[facility]                           GASTROENTEROLOGY OFFICE NOTE   NAME:Tracey Ward, Tracey Ward                        MRN:          [id number]  DATE:02/20/2006                            DOB:          1971-04-24    HISTORY OF PRESENT ILLNESS:  Tracey Ward comes in for a second opinion and is  referred by Dr. Conley Simmonds.  She has been worked up by Dr. Dorena Cookey in  the past and by Dr. Luisa Hart for recurrent fissures and hemorrhoids.  She  says she has some times 2-3 bowel movements a day, but she describes it as  constipation with a component of diarrhea as well.  She takes MiraLax, but  only takes it intermittently.  She is status post internal and external left  hemorrhoidectomy.  She has been tried with some Diltiazem gel, q.i.d. as  well as rectal creams and ointments without a great deal of success.  It has  been suggested she have an internal sphincterotomy and possibly her regular  bowel activity is secondary to medications.  It is amazing when you read  over her report, she has seen as well as Dr. Luisa Hart, Dr. Maryagnes Amos and also Dr.  Corliss Skains.  She has had a CT scan of her pelvis and there is no evidence of a  perirectal abscess or inflammatory process.   She has been evaluated by Dr. Dorena Cookey quite extensively for multiple GI  symptoms.  In July 2007, he indicated that her primary problem was her  hemorrhoids and constipation and noted that she was taking Metamucil.  She  got off the vitamins that she had been on, and was on MiraLax, although only  taking it once a day, intermittently, not even once a day.  She was unaware  at that time that you could take it more than once a day.  At that time, she  denied that she was having any upper gastrointestinal dyspeptic symptoms.  Dr. Madilyn Fireman did encourage her more liberal use of the MiraLax.  She was  continued on her Protonix.  It should be noted that Dr. Madilyn Fireman had done an  upper endoscopy on her which was  negative, but she had a positive Clo-test  and was treated with Prevpac.   It should be noted that she has had an abdominal ultrasound and a PIPIDA  scan which had been normal.  She does smoke occasionally as well.   One of Fiorela's complaints now concerns that she is still suffering with the  hemorrhoids, even though she has had multiple surgical therapies, and Dr.  Luisa Hart suggested having total sphincterotomy.  She says she has been  loosing weight, and she thinks this is on the basis of the stress from all  of these things.   PAST MEDICAL HISTORY:  1. Asthma in the past.  2. Anxiety.  3. Depression with panic disorders.  4. Two C-sections.   SOCIAL HISTORY:  Bachelor's degree.  She is a Futures trader.  Has a 6-year-old  child.  She says she is just stressed out with all of this along with the  difficulty of taking care of the child.   REVIEW OF SYSTEMS:  She underlines in review of systems sleeping problems,  no anxieties, no depression, no headaches.   PHYSICAL EXAMINATION:  VITAL SIGNS:  Height 5 feet 2 inches, weight 110,  blood pressure 108/70, pulse 60 and regular.  HEENT:  Oropharynx was negative.  NECK:  Negative.  CHEST:  Clear.  HEART:  Regular rhythm with a significant murmur.  ABDOMEN:  Soft, no masses or organomegaly.  RECTAL:  Deferred.  EXTREMITIES:  Unremarkable.   IMPRESSION:  1. Gastroesophageal reflux disease treated with proton pump inhibitors      b.i.d. with occasional dysphagia with medication only.  2. Irritable bowel syndrome with alternate diarrhea and constipation      component and probably more problems with constipation.  3. Severe problems with recurrent internal/external hemorrhoids requiring      multiple surgical treatments.  4. History of recurrent right upper quadrant pain with fatty food      intolerance, questionable etiology with negative gallbladder studies in      the recent past.  5. Anxiety/depression.  6. C-section.    RECOMMENDATIONS:  Have her take MiraLax until she has 1-2 bowel movements  daily.  Use baby wipes, Wet-Ones and so on.  Continue the Diltiazem if she  has disk pain.  Have a colonoscopic examination to be complete.  If her  symptoms persist, I would think that maybe she needs to be assessed with  rectal manometry to better explain why she is having so much difficulty at  this young age.  Certainly, medications could be considered as a causative  agent here.  We are scheduling her for a colonoscopic examination.                                   Ulyess Mort, MD   SML/MedQ  DD:  02/20/2006  DT:  02/21/2006  Job #:  [id number]   cc:   Randye Lobo, M.D.

## 2010-10-29 NOTE — Op Note (Signed)
Tracey Ward, Tracey Ward                 ACCOUNT NO.:  [id number]   MEDICAL RECORD NO.:  [id number]          PATIENT TYPE:  INP   LOCATION:  9319                          FACILITY:  [facility]   PHYSICIAN:  Randye Lobo, M.D.   DATE OF BIRTH:  1971/01/25   DATE OF PROCEDURE:  04/30/2004  DATE OF DISCHARGE:                                 OPERATIVE REPORT   PREOPERATIVE DIAGNOSES:  1.  Intrauterine gestation at 34 plus 3 weeks.  2.  Preeclampsia.  3.  Abnormal fetal Doppler studies.  4.  History of classical cesarean section.   POSTOPERATIVE DIAGNOSES:  1.  Intrauterine gestation at 34 plus 3 weeks.  2.  Preeclampsia.  3.  Abnormal fetal Doppler studies.  4.  History of classical cesarean section.   PROCEDURE:  Repeat cesarean section.   SURGEON:  Randye Lobo, M.D.   ASSISTANT:  Carrington Clamp, M.D.   ANESTHESIA:  Spinal.   IV FLUIDS:  1600 mL Ringer's lactate.   ESTIMATED BLOOD LOSS:  600 mL.   URINE OUTPUT:  25 mL.   COMPLICATIONS:  None.   INDICATIONS FOR PROCEDURE:  The patient is a 40 year old, gravida 2, para 0-  1-0-0 Caucasian female who presented to the office at 34 plus [redacted] weeks  gestation reporting decreased fetal movement.  The patient was noted to have  3 plus proteinuria and elevated blood pressure.  A nonstress test was noted  to be reactive. The patient had been followed closely in the pregnancy as  she had a history of a prior pregnancy with placental abruption at 23 weeks  due to severe preeclampsia and pulmonary edema and ultimately ended up with  a fetal demise.  The patient had been previously delivered by a classical  cesarean section. In this pregnancy, the patient had been followed with  frequent fetal monitoring including normal Doppler studies, a normal  biophysical profile, and normal amniotic fluid index three days prior. An  ultrasound documented normal growth but that the fetus was falling off a  growth curve.   The patient was admitted to  the [facility].  Her blood pressure at  that time was 120/94.  Preeclamptic labs were found to be normal. A 24 hour  urine study was initiated.  The patient did have repeat Doppler studies  which were abnormal in the umbilical artery and the middle cerebral artery.  A decision was therefore made to proceed with a repeat cesarean section on  April 30, 2004. The patient did one dose of betamethasone steroids.  The  risks, benefits, and alternatives to repeat cesarean section were discussed  with the patient and her husband and they chose to proceed.   FINDINGS:  A viable female was delivered in vigorous condition at 53 on  April 30, 2004. The Apgar's were 8 at 1 minute and 8 at 5 minutes.  The  length of the baby was later noted to be 16 1/2 inches. The placenta was  noted to be anterior and needed to be traversed for entry into the uterine  cavity. The amniotic fluid was  noted to be clear. The uterus, tubes and  ovaries were unremarkable.   SPECIMENS:  The placenta was sent to pathology.   DESCRIPTION OF PROCEDURE:  The patient was brought from her antenatal suite  to the operating room where a spinal anesthetic was administered.  The  patient was then placed in the supine position with a left lateral tilt and  the abdomen was sterilely prepped and a Foley catheter was sterilely placed  inside the bladder.  The patient was then sterilely draped.   A Pfannenstiel incision was created sharply with a scalpel along the line of  the patient's previous incision.  This was carried down to the fascia using  sharp dissection with the scalpel.  Monopolar cautery was used to create  hemostasis in the subcutaneous layer.  The fascia was then incised in the  midline and the incision was extended bilaterally with a Mayo scissors. The  rectus muscles were dissected off of the overlying fascia superiorly and  inferiorly and the rectus muscles were then sharply divided in the midline.  The  parietoperitoneum was grasped with two hemostat clamps and was entered  sharply.  The incision was extended cranially and caudally.   The lower uterine segment was exposed and the bladder flap was sharply  created.  The transverse lower uterine segment incision was then created  sharply with the scalpel.  The placenta was encountered with this incision.  The placenta was noted to extend anteriorly across a majority of the uterus  and the placenta therefore needed to be traversed to reach the membranes  which were ruptured and clear fluid was noted. The fetus was delivered  rapidly in the vertex position. The nares and mouth were suctioned, and the  cord was doubly clamped and cut.  The newborn was carried over to the  awaiting pediatricians in vigorous condition.   Cord blood was obtained for the [facility] and then a cord blood  collection was also performed under sterile conditions. The placenta was  then manually extracted. The uterus was exteriorized for its closure.  A  moistened lap pack was used to remove any remaining membranes and products  of conception from within the uterine cavity. The uterine incision was  closed with a double layer closure of #1 chromic. The first was a running  locked layer and the second was an imbricating layer.  There was some  bleeding noted along the mid portion of the inferior lower uterine segment  and this responded to figure-of-eight sutures of #1 chromic.  The uterine  incision was noted to be hemostatic and the uterus was then returned to the  peritoneal cavity. Irrigation was performed and the operative site was  reexamined and found to be hemostatic.   The abdomen was closed next. The peritoneum was closed with a running suture  of 3-0 Vicryl. The rectus muscles were reapproximated in the midline with  interrupted sutures of #1 chromic. The fascia was closed with a running suture of #0 Vicryl.  The subcutaneous tissue was then incised  with the  monopolar cautery instrument as there was scarring preventing easy closure  of the skin edges. Hemostasis was noted to be good.  The subcutaneous layer  was then irrigated and suctioned and the skin was closed with staples. A  sterile bandage was placed over this.   This concluded the patient's procedure. There were no complications.  All  needle, instrument and sponge counts were correct. The patient is escorted  to  the recovery room in stable and awake condition.     Broo   BES/MEDQ  D:  04/30/2004  T:  05/01/2004  Job:  [id number]

## 2010-10-29 NOTE — Discharge Summary (Signed)
Tracey Ward, Tracey Ward                 ACCOUNT NO.:  [id number]   MEDICAL RECORD NO.:  [id number]          PATIENT TYPE:  INP   LOCATION:  9319                          [facility]AC[facility]  [facility]   PHYSICIAN:  Malva Limes, M.D.    DATE OF BIRTH:  05/29/1971   DATE OF ADMISSION:  04/29/2004  DATE OF DISCHARGE:  05/04/2004                                 DISCHARGE SUMMARY   FINAL DIAGNOSES:  1.  Intrauterine pregnancy at 85 and two-sevenths weeks gestation.  2.  Preeclampsia.  3.  Abnormal fetal Doppler studies.  4.  History of classical cesarean section.   PROCEDURE:  Repeat cesarean section.  Surgeon:  Dr. Conley Simmonds.  Assistant:  Dr. Carrington Clamp.  Complications:  Wound seroma.   This 40 year old G2 P0-1-0-0 presents at 70 and two-sevenths weeks gestation  complaining of decreased fetal movement.  At that time, the patient was  noted to have 3+ protein in her urine and elevated blood pressure.  A  nonstress test was performed and was reactive.  Prior to this, the patient  had been followed closely during this pregnancy secondary to a history of  placental abruption at 23 weeks secondary to severe preeclampsia.  The  patient had also had a history of a classical cesarean section for this  reason.  The patient had been monitored with Doppler studies, BPPs, and  ultrasound for growth.  At this point, the patient was admitted to the  [facility], her lab work was normal, a 24-hour was initiated, and  repeat Doppler studies were performed that showed abnormal umbilical and  middle cerebral artery flow.  At this point, a decision was made to proceed  with a repeat cesarean section.  The patient had currently had one dose of  betamethasone.  The patient was taken to the operating room on April 30, 2004 by Dr. Conley Simmonds where a repeat cesarean section was performed with  the delivery of a 3-pound 11-ounce female infant with Apgars of 8 and 8.  Delivery went without complications.  The  patient's postoperative course was  complicated by some postoperative anemia which she was started on iron for  during her hospital stay.  The patient also had a postoperative wound seroma  which did not need any packing, was just draining at this point.  She was  finally felt ready for discharge on postoperative day #4.  She was having  some anxiety about the baby and she has had a history of depression.  She  was to speak with somebody in psych that day and was told to return to our  office on Monday for wound follow-up.  The patient was sent home on a  regular diet, told to decrease her activities, told to continue her prenatal  vitamins and her iron supplement.  She was given Percocet one to two q.4h.  as needed for pain, was to follow up on Monday for wound checkup and then  from there on as needed.   LABORATORY DATA ON DISCHARGE:  The patient had a hemoglobin 9.4, white blood  cell count  of 14.4.  Normal PIH labs.     [place]   MB/MEDQ  D:  06/10/2004  T:  06/10/2004  Job:  [id number]

## 2010-10-29 NOTE — Discharge Summary (Signed)
NAME:  Tracey Ward, Tracey Ward                  ACCOUNT NO.:  [id number]   MEDICAL RECORD NO.:  [id number]                   PATIENT TYPE:  INP   LOCATION:  9124                                 [facility]ACILITY[facility]  [facility]   PHYSICIAN:  Malva Limes, M.D.                 DATE OF BIRTH:  [birth year]   DATE OF ADMISSION:  02/03/2003  DATE OF DISCHARGE:  02/06/2003                                 DISCHARGE SUMMARY   PRINCIPAL DISCHARGE DIAGNOSES:  1. Intrauterine pregnancy at 23 weeks estimated gestation age.  2. Marginal separation of placenta.  3. Reactive airway disease.  4. History of depression and anxiety.  5. Tobacco use.  6. History of abnormal alpha-fetoprotein.  7. Oligohydramnios.  8. Hypertension.   PRINCIPAL PROCEDURES:  1. Fetal ultrasound.  2. Antibiotic therapy.  3. Treatment with IV steroids.   HISTORY OF PRESENT ILLNESS:  Ms. Tracey Ward is a 40 year old white female G1  P0 at 65 and one-seventh weeks who presented to [facility] on February 03, 2003 after she developed vaginal bleeding the evening prior to  admission.  The patient also was complaining of heavy dry cough with  congestion for one week in duration.  She stated that her abdomen was sore  from coughing excessively.  She was seen by Dr. Edward Jolly in the office and  noted to have decreased air movement in all lung fields.  The pelvic exam  revealed some dark brown staining.  There was no evidence of new vaginal  bleeding.  The patient was sent to [facility] for an albuterol  treatment but at that time she was noted to have mild elevation in her blood  pressure of 139/88.  The patient also had some proteinuria and therefore  preeclamptic laboratory values were obtained.  The patient's uric acid was  4.2, platelet count was 255, her liver enzymes were all normal.  The  remaining CBC was normal along with the remaining CMET was also normal.  The  patient underwent an ultrasound which revealed a 496 gram infant  consistent  with 22-and-a-half weeks estimated gestational age.  The amniotic fluid was  noted to be subjectively decreased.  There was a marginal hemorrhage of the  placenta in the lower uterine segment.   HOSPITAL COURSE:  The patient was admitted and started on IV fluids.  She  was also treated with Tussionex and albuterol inhaler and started on p.o.  Ceftin.  The patient's urine was sent for urine culture and sensitivity.  The patient on hospital day #2 continued to have wheezes in both lungs.  There was no evidence of any kind of congestion.  A chest x-ray was obtained  which was normal.  It was felt the patient either had an exacerbation of  reactive airway disease or a viral illness.  The patient had no more  bleeding through her vagina.  Her blood pressure did continue to be slightly  elevated.  She had  occasional systolics in the 160s and diastolics as high  as 100.  The patient did use Sudafed 12 hours prior to admission and  therefore this was followed.  The patient had no significant improvement in  her lung status in the evening of hospital day #2 and therefore after  consultation with pulmonary it was felt the patient would have some benefit  from steroid treatment.  Therefore, she was begun on Solu-Medrol 80 mg IV  q.8h.  On hospital day #3 the patient's breathing was significantly  improved.  Her urine culture and sensitivity remained negative.  Other than  being tired, the patient stated that she was doing well.  The patient's  blood pressures were 120 to 160 over 80 to 92.  The patient was reevaluated  in the afternoon of August 26 and doing well, and therefore was instructed  to go home.  The patient did have a follow-up amniotic fluid volume on  hospital day #2, again subjective oligohydramnios was identified.  No  obvious etiology could be identified.  Of note was this patient did have an  abnormal AFP earlier in pregnancy.  She did have normal ultrasound performed   at [facility] and a genetic ultrasound which was 46,XY.  The patient was  discharged to home on prednisone  40 mg daily with a tapering dose.  She was instructed to follow up in the  office on Monday for repeat fluid volume and blood pressure check.  The  patient was told to contact the office if she had any difficulty with  breathing.                                               Malva Limes, M.D.    MA/MEDQ  D:  02/10/2003  T:  02/10/2003  Job:  [id number]

## 2010-10-29 NOTE — Consult Note (Signed)
NAMEMARGAUX, Tracey Ward                 ACCOUNT NO.:  [id number]   MEDICAL RECORD NO.:  [id number]          PATIENT TYPE:  EMS   LOCATION:  ED                           FACILITY:  [facility]   PHYSICIAN:  Lebron Conners, M.D.   DATE OF BIRTH:  [birth year]   DATE OF CONSULTATION:  12/31/2005  DATE OF DISCHARGE:                                   CONSULTATION   CHIEF COMPLAINT:  Anal pain.   PRESENT ILLNESS:  The patient is a 40 year old white female who has had  recent recurring trouble with diarrhea and with constipation and with  anorectal pain and has had a couple of episodes of thrombosed hemorrhoids  treated by incision and excision.  She recently had a bout of diarrhea and  has developed, what feels to her, like another thrombosed hemorrhoid.  She  recently was seen by Dr. Maryagnes Ward at the office and thought possibly to have a  perirectal abscess.  Aspiration produced no pus and a CT scan was negative.  She presents today for attention to this problem.   PAST MEDICAL HISTORY:  She had no real problem with this in pregnancy.  She  has no other chronic GI problems or chronic medical problems.  She is  generally healthy.   PHYSICAL EXAMINATION:  There are two small obvious thrombosed external  hemorrhoids posteriorly.  The digital rectal exam is unremarkable.  There is  no impaction.   IMPRESSION:  Thrombosed external hemorrhoids.   PLAN:  After the patient gave permission and after some sedation and  analgesia, I thoroughly anesthetized the area and removed the top of two  thrombosed hemorrhoid and enucleated all the clot which I could see.  I  controlled a couple of small bleeders with 4-0 chromic suture.  I then  packed a small epinephrine smoke soaked anesthetic sponge on the area and  applied a bulky slightly compressive bandage.   The patient is given instructions about continuation of good anal hygiene.  She has an appointment to see Dr. Luisa Ward in the office next month and I  encouraged her to keep that appointment.  Return p.r.n., otherwise.      Lebron Conners, M.D.  Electronically Signed     WB/MEDQ  D:  12/31/2005  T:  12/31/2005  Job:  [id number]

## 2011-03-11 ENCOUNTER — Encounter: Payer: Self-pay | Admitting: Internal Medicine

## 2012-05-03 ENCOUNTER — Other Ambulatory Visit: Payer: Self-pay | Admitting: Family Medicine

## 2012-05-03 DIAGNOSIS — Z1231 Encounter for screening mammogram for malignant neoplasm of breast: Secondary | ICD-10-CM

## 2013-01-25 ENCOUNTER — Encounter (HOSPITAL_BASED_OUTPATIENT_CLINIC_OR_DEPARTMENT_OTHER): Payer: Self-pay | Admitting: *Deleted

## 2013-01-25 ENCOUNTER — Emergency Department (HOSPITAL_BASED_OUTPATIENT_CLINIC_OR_DEPARTMENT_OTHER): Payer: BC Managed Care – PPO

## 2013-01-25 ENCOUNTER — Inpatient Hospital Stay (HOSPITAL_BASED_OUTPATIENT_CLINIC_OR_DEPARTMENT_OTHER)
Admission: EM | Admit: 2013-01-25 | Discharge: 2013-02-02 | DRG: 588 | Disposition: A | Discharge status: Home / Self Care | Payer: BC Managed Care – PPO | Attending: Internal Medicine | Admitting: Internal Medicine

## 2013-01-25 DIAGNOSIS — J9600 Acute respiratory failure, unspecified whether with hypoxia or hypercapnia: Secondary | ICD-10-CM | POA: Diagnosis present

## 2013-01-25 DIAGNOSIS — F3289 Other specified depressive episodes: Secondary | ICD-10-CM | POA: Diagnosis present

## 2013-01-25 DIAGNOSIS — F17200 Nicotine dependence, unspecified, uncomplicated: Secondary | ICD-10-CM | POA: Diagnosis present

## 2013-01-25 DIAGNOSIS — J45901 Unspecified asthma with (acute) exacerbation: Principal | ICD-10-CM | POA: Diagnosis present

## 2013-01-25 DIAGNOSIS — E872 Acidosis, unspecified: Secondary | ICD-10-CM | POA: Diagnosis present

## 2013-01-25 DIAGNOSIS — F411 Generalized anxiety disorder: Secondary | ICD-10-CM | POA: Diagnosis present

## 2013-01-25 DIAGNOSIS — R0902 Hypoxemia: Secondary | ICD-10-CM

## 2013-01-25 DIAGNOSIS — K219 Gastro-esophageal reflux disease without esophagitis: Secondary | ICD-10-CM | POA: Diagnosis present

## 2013-01-25 DIAGNOSIS — T380X5A Adverse effect of glucocorticoids and synthetic analogues, initial encounter: Secondary | ICD-10-CM | POA: Diagnosis present

## 2013-01-25 DIAGNOSIS — D709 Neutropenia, unspecified: Secondary | ICD-10-CM | POA: Diagnosis present

## 2013-01-25 DIAGNOSIS — J4521 Mild intermittent asthma with (acute) exacerbation: Secondary | ICD-10-CM | POA: Diagnosis present

## 2013-01-25 DIAGNOSIS — R7309 Other abnormal glucose: Secondary | ICD-10-CM | POA: Diagnosis not present

## 2013-01-25 DIAGNOSIS — F329 Major depressive disorder, single episode, unspecified: Secondary | ICD-10-CM | POA: Diagnosis present

## 2013-01-25 DIAGNOSIS — J4541 Moderate persistent asthma with (acute) exacerbation: Secondary | ICD-10-CM

## 2013-01-25 DIAGNOSIS — IMO0002 Reserved for concepts with insufficient information to code with codable children: Secondary | ICD-10-CM

## 2013-01-25 DIAGNOSIS — D72829 Elevated white blood cell count, unspecified: Secondary | ICD-10-CM

## 2013-01-25 HISTORY — DX: Other specified behavioral and emotional disorders with onset usually occurring in childhood and adolescence: F98.8

## 2013-01-25 HISTORY — DX: Other hemorrhoids: K64.8

## 2013-01-25 HISTORY — DX: Depression, unspecified: F32.A

## 2013-01-25 HISTORY — DX: Unspecified asthma, uncomplicated: J45.909

## 2013-01-25 HISTORY — DX: Major depressive disorder, single episode, unspecified: F32.9

## 2013-01-25 HISTORY — DX: Personal history of other complications of pregnancy, childbirth and the puerperium: Z87.59

## 2013-01-25 LAB — POCT I-STAT 3, ART BLOOD GAS (G3+)
Acid-base deficit: 1 mmol/L (ref 0.0–2.0)
Bicarbonate: 23.2 mEq/L (ref 20.0–24.0)
O2 Saturation: 94 %
pCO2 arterial: 37.2 mmHg (ref 35.0–45.0)
pO2, Arterial: 71 mmHg — ABNORMAL LOW (ref 80.0–100.0)

## 2013-01-25 LAB — CBC WITH DIFFERENTIAL/PLATELET
Eosinophils Absolute: 1.2 10*3/uL — ABNORMAL HIGH (ref 0.0–0.7)
Hemoglobin: 12.9 g/dL (ref 12.0–15.0)
Lymphocytes Relative: 30 % (ref 12–46)
Lymphs Abs: 2.2 10*3/uL (ref 0.7–4.0)
MCH: 30.5 pg (ref 26.0–34.0)
Monocytes Relative: 7 % (ref 3–12)
Neutro Abs: 3.3 10*3/uL (ref 1.7–7.7)
Neutrophils Relative %: 46 % (ref 43–77)
RBC: 4.23 MIL/uL (ref 3.87–5.11)
WBC: 7.3 10*3/uL (ref 4.0–10.5)

## 2013-01-25 LAB — CBC
HCT: 39 % (ref 36.0–46.0)
MCH: 29.8 pg (ref 26.0–34.0)
MCHC: 32.8 g/dL (ref 30.0–36.0)
MCV: 90.9 fL (ref 78.0–100.0)
Platelets: 270 10*3/uL (ref 150–400)
RDW: 13 % (ref 11.5–15.5)

## 2013-01-25 LAB — CREATININE, SERUM: GFR calc Af Amer: >90 mL/min (ref >90)

## 2013-01-25 LAB — BASIC METABOLIC PANEL
BUN: 8 mg/dL (ref 6–23)
CO2: 26 mEq/L (ref 19–32)
Chloride: 109 mEq/L (ref 96–112)
Glucose, Bld: 102 mg/dL — ABNORMAL HIGH (ref 70–99)
Potassium: 4.3 mEq/L (ref 3.5–5.1)

## 2013-01-25 LAB — BLOOD GAS, ARTERIAL
Acid-base deficit: 1.6 mmol/L (ref 0.0–2.0)
Drawn by: 28337
FIO2: 100 %
O2 Saturation: 98.4 %
Patient temperature: 98.6
TCO2: 25.3 mmol/L (ref 0–100)
pCO2 arterial: 48.8 mmHg — ABNORMAL HIGH (ref 35.0–45.0)

## 2013-01-25 LAB — MRSA PCR SCREENING: MRSA by PCR: NEGATIVE

## 2013-01-25 MED ORDER — SODIUM CHLORIDE 0.9 % IV SOLN
INTRAVENOUS | Status: DC
  Administered 2013-01-25 – 2013-01-26 (×2): via INTRAVENOUS
  Administered 2013-01-31: 1000 mL via INTRAVENOUS
  Administered 2013-02-01: 17:00:00 via INTRAVENOUS

## 2013-01-25 MED ORDER — LEVALBUTEROL HCL 1.25 MG/0.5ML IN NEBU
1.2500 mg | INHALATION_SOLUTION | RESPIRATORY_TRACT | Status: DC | PRN
  Administered 2013-01-25 – 2013-01-31 (×7): 1.25 mg via RESPIRATORY_TRACT
  Filled 2013-01-25 (×2): qty 0.5

## 2013-01-25 MED ORDER — ONDANSETRON HCL 4 MG PO TABS
4.0000 mg | ORAL_TABLET | Freq: Four times a day (QID) | ORAL | Status: DC | PRN
  Administered 2013-01-31 – 2013-02-01 (×2): 4 mg via ORAL
  Filled 2013-01-25 (×2): qty 1

## 2013-01-25 MED ORDER — LORAZEPAM 2 MG/ML IJ SOLN
1.0000 mg | Freq: Four times a day (QID) | INTRAMUSCULAR | Status: DC | PRN
  Filled 2013-01-25: qty 1

## 2013-01-25 MED ORDER — LEVALBUTEROL HCL 1.25 MG/0.5ML IN NEBU
1.2500 mg | INHALATION_SOLUTION | Freq: Four times a day (QID) | RESPIRATORY_TRACT | Status: DC
  Administered 2013-01-26 – 2013-01-29 (×12): 1.25 mg via RESPIRATORY_TRACT
  Filled 2013-01-25: qty 6
  Filled 2013-01-25 (×2): qty 0.5
  Filled 2013-01-25 (×2): qty 6
  Filled 2013-01-25 (×4): qty 0.5
  Filled 2013-01-25 (×2): qty 6
  Filled 2013-01-25 (×3): qty 0.5
  Filled 2013-01-25: qty 6
  Filled 2013-01-25 (×2): qty 0.5
  Filled 2013-01-25: qty 6
  Filled 2013-01-25: qty 0.5

## 2013-01-25 MED ORDER — LORAZEPAM 2 MG/ML IJ SOLN
INTRAMUSCULAR | Status: AC
  Filled 2013-01-25: qty 1

## 2013-01-25 MED ORDER — MORPHINE SULFATE 2 MG/ML IJ SOLN
2.0000 mg | Freq: Once | INTRAMUSCULAR | Status: AC
  Administered 2013-01-25: 2 mg via INTRAVENOUS
  Filled 2013-01-25: qty 1

## 2013-01-25 MED ORDER — SODIUM CHLORIDE 0.9 % IV SOLN
Freq: Once | INTRAVENOUS | Status: AC
  Administered 2013-01-25: 15:00:00 via INTRAVENOUS

## 2013-01-25 MED ORDER — ALBUTEROL SULFATE (5 MG/ML) 0.5% IN NEBU
INHALATION_SOLUTION | RESPIRATORY_TRACT | Status: AC
  Administered 2013-01-25: 10 mg/h via RESPIRATORY_TRACT
  Filled 2013-01-25: qty 0.5

## 2013-01-25 MED ORDER — ONDANSETRON HCL 4 MG/2ML IJ SOLN
4.0000 mg | Freq: Four times a day (QID) | INTRAMUSCULAR | Status: DC | PRN
  Administered 2013-01-26: 4 mg via INTRAVENOUS
  Filled 2013-01-25 (×2): qty 2

## 2013-01-25 MED ORDER — MAGNESIUM SULFATE 50 % IJ SOLN
2.0000 g | INTRAVENOUS | Status: AC
  Administered 2013-01-25: 2 g via INTRAVENOUS
  Filled 2013-01-25: qty 4

## 2013-01-25 MED ORDER — LORAZEPAM 2 MG/ML IJ SOLN
1.0000 mg | Freq: Once | INTRAMUSCULAR | Status: AC
  Administered 2013-01-25: 1 mg via INTRAVENOUS

## 2013-01-25 MED ORDER — SODIUM CHLORIDE 0.9 % IJ SOLN
3.0000 mL | Freq: Two times a day (BID) | INTRAMUSCULAR | Status: DC
  Administered 2013-01-26 – 2013-02-02 (×11): 3 mL via INTRAVENOUS

## 2013-01-25 MED ORDER — FLUCONAZOLE IN SODIUM CHLORIDE 200-0.9 MG/100ML-% IV SOLN
200.0000 mg | Freq: Once | INTRAVENOUS | Status: DC
  Filled 2013-01-25: qty 100

## 2013-01-25 MED ORDER — ALBUTEROL (5 MG/ML) CONTINUOUS INHALATION SOLN
10.0000 mg/h | INHALATION_SOLUTION | RESPIRATORY_TRACT | Status: DC
  Administered 2013-01-25 (×2): 10 mg/h via RESPIRATORY_TRACT
  Filled 2013-01-25: qty 20

## 2013-01-25 MED ORDER — METHYLPREDNISOLONE SODIUM SUCC 125 MG IJ SOLR
80.0000 mg | Freq: Three times a day (TID) | INTRAMUSCULAR | Status: DC
  Administered 2013-01-25 – 2013-01-27 (×5): 80 mg via INTRAVENOUS
  Filled 2013-01-25 (×8): qty 1.28

## 2013-01-25 MED ORDER — ALBUTEROL SULFATE (5 MG/ML) 0.5% IN NEBU: 2.5000 mg | INHALATION_SOLUTION | RESPIRATORY_TRACT | Status: DC | PRN

## 2013-01-25 MED ORDER — IPRATROPIUM BROMIDE 0.02 % IN SOLN
RESPIRATORY_TRACT | Status: AC
  Administered 2013-01-25: 0.5 mg
  Filled 2013-01-25: qty 2.5

## 2013-01-25 MED ORDER — BUDESONIDE 0.25 MG/2ML IN SUSP
0.2500 mg | Freq: Two times a day (BID) | RESPIRATORY_TRACT | Status: DC
  Administered 2013-01-25 – 2013-02-02 (×16): 0.25 mg via RESPIRATORY_TRACT
  Filled 2013-01-25 (×18): qty 2

## 2013-01-25 MED ORDER — ONDANSETRON HCL 4 MG/2ML IJ SOLN
4.0000 mg | INTRAMUSCULAR | Status: AC
  Administered 2013-01-25: 4 mg via INTRAVENOUS
  Filled 2013-01-25: qty 2

## 2013-01-25 MED ORDER — METHYLPREDNISOLONE SODIUM SUCC 125 MG IJ SOLR
125.0000 mg | Freq: Once | INTRAMUSCULAR | Status: AC
  Administered 2013-01-25: 125 mg via INTRAVENOUS
  Filled 2013-01-25: qty 2

## 2013-01-25 MED ORDER — LEVOFLOXACIN IN D5W 750 MG/150ML IV SOLN
750.0000 mg | INTRAVENOUS | Status: DC
  Administered 2013-01-25: 750 mg via INTRAVENOUS
  Filled 2013-01-25 (×2): qty 150

## 2013-01-25 MED ORDER — LORAZEPAM 2 MG/ML IJ SOLN
1.0000 mg | Freq: Once | INTRAMUSCULAR | Status: DC
  Administered 2013-01-25: 1 mg via INTRAVENOUS

## 2013-01-25 MED ORDER — LORAZEPAM BOLUS VIA INFUSION: 1.0000 mg | INTRAVENOUS | Status: DC | PRN

## 2013-01-25 MED ORDER — GUAIFENESIN-DM 100-10 MG/5ML PO SYRP
5.0000 mL | ORAL_SOLUTION | ORAL | Status: DC | PRN
  Administered 2013-01-25 – 2013-01-26 (×2): 5 mL via ORAL
  Filled 2013-01-25 (×3): qty 5

## 2013-01-25 MED ORDER — PREDNISONE 50 MG PO TABS
60.0000 mg | ORAL_TABLET | Freq: Once | ORAL | Status: AC
  Administered 2013-01-25: 60 mg via ORAL
  Filled 2013-01-25: qty 1

## 2013-01-25 MED ORDER — ACETAMINOPHEN 650 MG RE SUPP: 650.0000 mg | Freq: Four times a day (QID) | RECTAL | Status: DC | PRN

## 2013-01-25 MED ORDER — ACETAMINOPHEN 325 MG PO TABS
650.0000 mg | ORAL_TABLET | Freq: Four times a day (QID) | ORAL | Status: DC | PRN
  Administered 2013-01-26: 650 mg via ORAL
  Filled 2013-01-25: qty 2

## 2013-01-25 MED ORDER — ENOXAPARIN SODIUM 40 MG/0.4ML ~~LOC~~ SOLN
40.0000 mg | SUBCUTANEOUS | Status: DC
  Administered 2013-01-25 – 2013-02-01 (×7): 40 mg via SUBCUTANEOUS
  Filled 2013-01-25 (×9): qty 0.4

## 2013-01-25 NOTE — ED Provider Notes (Signed)
CSN: [id number]     Arrival date & time 01/25/13  1233 History     First MD Initiated Contact with Patient 01/25/13 1249     Chief Complaint  Patient presents with  . Asthma   (Consider location/radiation/quality/duration/timing/severity/associated sxs/prior Treatment) HPI Comments: Patient is a 42 year old female with a history of asthma who presents for shortness of breath, progressively worsening x5 days. Patient states that symptoms have been associated with a congestive, nonproductive cough, and are worse with exertion. Patient has been taking Mucinex for symptoms without relief. She denies associated fever, syncope, nausea or vomiting, and chest pain. Patient denies recent hospitalizations or surgeries, long car rides or plane flights, and birth control use. She further denies ever being hospitalized for an asthma exacerbation and states she has never been intubated for an asthma exacerbation. Patient presents to the emergency department from her PCPs office.  PCP - Dr. [name]  The history is provided by the patient. No language interpreter was used.    Past Medical History  Diagnosis Date  . Asthma   . Depression    Past Surgical History  Procedure Laterality Date  . Foot surgery     History reviewed. No pertinent family history. History  Substance Use Topics  . Smoking status: Current Some Day Smoker -- 0.01 packs/day    Types: Cigarettes  . Smokeless tobacco: Not on file  . Alcohol Use: No   OB History   Grav Para Term Preterm Abortions TAB SAB Ect Mult Living                 Review of Systems  Constitutional: Negative for fever.  Eyes: Negative for visual disturbance.  Respiratory: Positive for cough (congested), shortness of breath and wheezing.   Cardiovascular: Negative for chest pain.  Gastrointestinal: Negative for nausea and vomiting.  Skin: Negative for pallor and rash.  Neurological: Negative for syncope.  All other systems reviewed and are  negative.   Allergies  Review of patient's allergies indicates no known allergies.  Home Medications   Current Outpatient Rx  Name  Route  Sig  Dispense  Refill  . albuterol (ACCUNEB) 1.25 MG/3ML nebulizer solution   Nebulization   Take 1 ampule by nebulization every 6 (six) hours as needed for wheezing.         . diazepam (VALIUM) 10 MG tablet   Oral   Take 10 mg by mouth every 6 (six) hours as needed for anxiety.         . methylphenidate (RITALIN) 10 MG tablet   Oral   Take 10 mg by mouth 4 (four) times daily.          BP 117/51  Pulse 96  Temp(Src) 98.3 F (36.8 C) (Oral)  Resp 24  SpO2 94%  Physical Exam  Nursing note and vitals reviewed. Constitutional: She is oriented to person, place, and time. She appears well-developed and well-nourished. No distress.  Patient speaks in broken sentences secondary to shortness of breath  HENT:  Head: Normocephalic and atraumatic.  Mouth/Throat: Oropharynx is clear and moist. No oropharyngeal exudate.  Eyes: Conjunctivae and EOM are normal. No scleral icterus.  Neck: Normal range of motion.  Cardiovascular: Regular rhythm, normal heart sounds and intact distal pulses.   Tachycardic rate to 112  Pulmonary/Chest: She has wheezes (diffuse inspiratory and expiratory). She has no rales. She exhibits no tenderness.  Patient tachypneic. +Accessory muscle use and retractions.  Abdominal: Soft. She exhibits no distension. There is no tenderness.  Musculoskeletal: Normal range of motion.  Neurological: She is alert and oriented to person, place, and time.  Skin: Skin is warm and dry. No rash noted. She is not diaphoretic. No erythema. No pallor.  Psychiatric: She has a normal mood and affect. Her behavior is normal.   ED Course   Procedures (including critical care time)  Labs Reviewed  CBC WITH DIFFERENTIAL - Abnormal; Notable for the following:    Eosinophils Relative 17 (*)    Eosinophils Absolute 1.2 (*)    All other  components within normal limits  BASIC METABOLIC PANEL - Abnormal; Notable for the following:    Glucose, Bld 102 (*)    All other components within normal limits  POCT I-STAT 3, BLOOD GAS (G3+) - Abnormal; Notable for the following:    pO2, Arterial 71.0 (*)    All other components within normal limits  BLOOD GAS, ARTERIAL   Dg Chest Port 1 View  01/25/2013   *RADIOLOGY REPORT*  Clinical Data: Chest pain and shortness of breath.  PORTABLE CHEST - 1 VIEW  Comparison: 07/21/2008.  Findings: The cardiac silhouette, mediastinal and hilar contours are within normal limits and stable.  The lungs demonstrate mild peribronchial thickening and increased interstitial markings. These changes could be related to smoking or bronchitis.  No focal infiltrates or effusions.  The bony thorax is intact.  IMPRESSION: Bronchitic type lung changes could be related to smoking or bronchitis.  No definite infiltrates.   Original Report Authenticated By: Rudie Meyer, M.D.   1. Asthma exacerbation, moderate persistent   2. Hypoxia    MDM  42 year old female with a history of asthma who presents for shortness of breath and asthma exacerbation, worsening x5 days. Patient tachypneic on arrival with retractions and accessory muscle usage; satting 87% on room air. Patient without leukocytosis or electrolyte imbalance. Chest x-ray significant for bronchitic changes; patient does have hx of tobacco use. Patient given DuoNeb as well as continuous albuterol treatment in addition to by mouth prednisone; O2 sats have remained at 88% on RA despite ED treatments. MgSO4 ordered and consult for admission placed.  Patient to be transferred to [facility] for admission for asthma exacerbation with hypoxia. Receiving physician - Dr. Penne Lash. Temp admit orders placed and EMTALA completed.    Antony Madura, PA-C 01/25/13 [phone]

## 2013-01-25 NOTE — ED Notes (Signed)
Pt from [facility].  States that she cannot tolerate steroids.  Pt speaking in broken sentences, labored breaking.

## 2013-01-25 NOTE — Consult Note (Signed)
PULMONARY  / CRITICAL CARE MEDICINE  Name: Tracey Ward MRN: [id number] DOB: [birth year]    ADMISSION DATE:  01/25/2013 CONSULTATION DATE:  01/25/13  REFERRING MD :  Jeoffrey Massed PRIMARY SERVICE: [facility]  CHIEF COMPLAINT:   Acute asthma exacerbation  BRIEF PATIENT DESCRIPTION:  42 years old female with PMH relevant for anxiety and asthma. Admitted by [facility] with an acute asthma exacerbation and respiratory distress.   SIGNIFICANT EVENTS / STUDIES:  Chest X ray with no acute parenchymal infiltrates.  LINES / TUBES: - Peripheral IV's  CULTURES:   ANTIBIOTICS: - Levaquin  HISTORY OF PRESENT ILLNESS:   42 years old female with PMH relevant for depression, anxiety and asthma. She takes 10 mg of valium 3 to 4 times per day. She is usually well controlled and only on PRN albuterol for her asthma. She does not have frequent symptoms, exacerbations or recent hospitalizations for asthma. She states that hot and humid environments trigger her asthma but no other factors. She has been wheezing and coughing for 3 weeks despite prednisone taper and inhalers. She got worse over the last 5 days and was admitted today with significant respiratory distress. Denies fever, chills, sputum production. She does have some chest pain with coughing.  At admission she was saturating 87% on RA but improved to 97% with NRB mask. Her initial ABG showed 7.4/37/71 and 4 hrs after 7.30/48/117. She was admitted to SD and PCCM consult was called. At the time of my exam the patient is awake, alert, oriented x 3, in mild to moderate respiratory distress and with significant anxiety. Her RR is 22 to 30, she is sat 98%, is normotensive and her HR is in the low 100's. On exam she has bilateral diffuse wheezing but moving air adequately. She got IV methylprednisolone and is getting frequent albuterol nebulizations. The patient states that she is starting to feel better.   PAST MEDICAL HISTORY :  Past  Medical History  Diagnosis Date  . Asthma   . Depression    Past Surgical History  Procedure Laterality Date  . Foot surgery     Prior to Admission medications   Medication Sig Start Date End Date Taking? Authorizing Provider  albuterol (PROAIR HFA) 108 (90 BASE) MCG/ACT inhaler Inhale 2 puffs into the lungs every 6 (six) hours as needed for wheezing or shortness of breath.   Yes Historical Provider, MD  albuterol (PROVENTIL) (2.5 MG/3ML) 0.083% nebulizer solution Take 2.5 mg by nebulization every 6 (six) hours as needed for wheezing or shortness of breath.   Yes Historical Provider, MD  diazepam (VALIUM) 10 MG tablet Take 10 mg by mouth every 6 (six) hours as needed for anxiety.   Yes Historical Provider, MD  diphenhydrAMINE (BENADRYL) 25 MG tablet Take 25 mg by mouth 2 (two) times daily as needed (congestion).   Yes Historical Provider, MD  guaiFENesin (MUCINEX) 600 MG 12 hr tablet Take 1,200 mg by mouth 2 (two) times daily as needed for congestion.   Yes Historical Provider, MD  ibuprofen (ADVIL,MOTRIN) 200 MG tablet Take 800 mg by mouth 3 (three) times daily as needed for pain.   Yes Historical Provider, MD  methylphenidate (RITALIN) 10 MG tablet Take 10 mg by mouth See admin instructions. Take 1 tablet (10 mg) 4 times daily, may reduce to 2 times daily if feeling hyper   Yes Historical Provider, MD  Vilazodone HCl (VIIBRYD) 20 MG TABS Take 20 mg by mouth daily.   Yes Historical Provider,  MD   No Known Allergies  FAMILY HISTORY:  History reviewed. No pertinent family history. SOCIAL HISTORY:  reports that she has been smoking Cigarettes.  She has been smoking about 0.01 packs per day. She does not have any smokeless tobacco history on file. She reports that she does not drink alcohol or use illicit drugs.  REVIEW OF SYSTEMS:  All systems reviewed and found negative except for what I mentioned in the HPI.  SUBJECTIVE:   VITAL SIGNS: Temp:  [98.3 F (36.8 C)-98.7 F (37.1 C)] 98.7  F (37.1 C) (08/15 1821) Pulse Rate:  [96-114] 114 (08/15 1821) Resp:  [24-30] 26 (08/15 1821) BP: (117-146)/(51-72) 146/70 mmHg (08/15 1821) SpO2:  [87 %-100 %] 100 % (08/15 1934) Weight:  [188 lb 7.9 oz (85.5 kg)] 188 lb 7.9 oz (85.5 kg) (08/15 1821) HEMODYNAMICS:   VENTILATOR SETTINGS:   INTAKE / OUTPUT: Intake/Output   None     PHYSICAL EXAMINATION: General: No apparent distress. Eyes: Anicteric sclerae. ENT: Oropharynx clear. Dry mucous membranes. No thrush Lymph: No cervical, supraclavicular, or axillary lymphadenopathy. Heart: Normal S1, S2. No murmurs, rubs, or gallops appreciated. No bruits, equal pulses. Lungs: Bilateral diffuse wheezing, no crackles. Moving air adequately.  Abdomen: Abdomen soft, non-tender and not distended, normoactive bowel sounds. No hepatosplenomegaly or masses. Musculoskeletal: No clubbing or synovitis. Skin: No rashes or lesions Neuro: No focal neurologic deficits.  LABS:  CBC Recent Labs     01/25/13  1320  01/25/13  1930  WBC  7.3  9.9  HGB  12.9  12.8  HCT  39.4  39.0  PLT  271  270   Coag's No results found for this basename: APTT, INR,  in the last 72 hours BMET Recent Labs     01/25/13  1320  01/25/13  1930  NA  143   --   K  4.3   --   CL  109   --   CO2  26   --   BUN  8   --   CREATININE  0.80  0.72  GLUCOSE  102*   --    Electrolytes Recent Labs     01/25/13  1320  CALCIUM  9.5   Sepsis Markers No results found for this basename: LACTICACIDVEN, PROCALCITON, O2SATVEN,  in the last 72 hours ABG Recent Labs     01/25/13  1447  01/25/13  1855  PHART  7.403  7.309*  PCO2ART  37.2  48.8*  PO2ART  71.0*  117.0*   Liver Enzymes No results found for this basename: AST, ALT, ALKPHOS, BILITOT, ALBUMIN,  in the last 72 hours Cardiac Enzymes No results found for this basename: TROPONINI, PROBNP,  in the last 72 hours Glucose No results found for this basename: GLUCAP,  in the last 72 hours  Imaging Dg  Chest Port 1 View  01/25/2013   *RADIOLOGY REPORT*  Clinical Data: Chest pain and shortness of breath.  PORTABLE CHEST - 1 VIEW  Comparison: 07/21/2008.  Findings: The cardiac silhouette, mediastinal and hilar contours are within normal limits and stable.  The lungs demonstrate mild peribronchial thickening and increased interstitial markings. These changes could be related to smoking or bronchitis.  No focal infiltrates or effusions.  The bony thorax is intact.  IMPRESSION: Bronchitic type lung changes could be related to smoking or bronchitis.  No definite infiltrates.   Original Report Authenticated By: Rudie Meyer, M.D.     CXR: - No acute parenchymal infiltrates.   ASSESSMENT /  PLAN:  PULMONARY A: 1) Acute asthma exacerbation with hypercarbic respiratory failure. 2) Significant component of anxiety contributing to her SOB P:   - Will transfer to SD room with camera for closer monitoring - Agree with BiPAP if worsening hypercarbia and respiratory distress. - Will need more treatment for anxiety if she requires BiPAP - Agree with antibiotic - Agree with IV steroids - Agree with frequent nebs. - We will take her to our service if worsening respiratory failure despite current treatment.   I have personally obtained a history, examined the patient, evaluated laboratory and imaging results, formulated the assessment and plan and placed orders. CRITICAL CARE: Critical Care Time devoted to patient care services described in this note is 45 minutes.   Overton Mam, MD Pulmonary and Critical Care Medicine [facility] Pager: [phone]  01/25/2013, 9:45 PM

## 2013-01-25 NOTE — H&P (Signed)
PATIENT DETAILS Name: Tracey Ward Age: 42 y.o. Sex: female Date of Birth: 1970-06-15 Admit Date: 01/25/2013 PCP:No primary provider on file.   CHIEF COMPLAINT:  Shortness of breath worsening for the past 2-5 days, ongoing for 3 weeks  HPI: Tracey Ward is a 42 y.o. female with a Past Medical History of bronchial asthma, anxiety and depression who presents today with the above noted complaint. The patient for the past 3 weeks she has had difficulty breathing, she has seen her primary care practitioner will provide her with prednisone taper and started her on numerous inhalers. Unfortunately she has not had a good response to these treatments, and her shortness of breath has continued to worsen. Over the past 5 days, her shortness of breath has worsened significantly, particularly worse with exertion. She also has a nonproductive cough. No hemoptysis. She presented to Med Center in [facility] with these complaints, upon initial presentation patient was labored and was not able to speak in full sentences. She apparently improved somewhat with nebulized bronchodilators, the hospitalist service was asked to admit this patient, she was initially accepted to a med-surg floor. Unfortunately while at med center [facility], patient was noted to have further worsening of her shortness of breath and the hospitalist service was reconsulted, ED M.D. spoke with Dr. Ardyth Harps, who upgraded patient to step down. Upon arrival to the step down unit, the patient was noted to be in acute respiratory distress and using some of accessory muscles , she was speaking in broken sentences. She appeared very anxious.  She denies any chest pain, but does claim to be tight in her chest. There is no history of fever. There is a history of nausea vomiting or diarrhea. Patient is very anxious, and was very hesitant in trying BiPAP therapy.  ALLERGIES:  No Known Allergies  PAST MEDICAL HISTORY: Past Medical History  Diagnosis  Date  . Asthma   . Depression     PAST SURGICAL HISTORY: Past Surgical History  Procedure Laterality Date  . Foot surgery      MEDICATIONS AT HOME: Prior to Admission medications   Medication Sig Start Date End Date Taking? Authorizing Provider  albuterol (ACCUNEB) 1.25 MG/3ML nebulizer solution Take 1 ampule by nebulization every 6 (six) hours as needed for wheezing.   Yes Historical Provider, MD  diazepam (VALIUM) 10 MG tablet Take 10 mg by mouth every 6 (six) hours as needed for anxiety.   Yes Historical Provider, MD  methylphenidate (RITALIN) 10 MG tablet Take 10 mg by mouth 4 (four) times daily.   Yes Historical Provider, MD    FAMILY HISTORY: No family history of coronary disease  SOCIAL HISTORY:  reports that she has been smoking Cigarettes.  She has been smoking about 0.01 packs per day. She does not have any smokeless tobacco history on file. She reports that she does not drink alcohol or use illicit drugs.  REVIEW OF SYSTEMS:  Constitutional:   No  weight loss, night sweats,  Fevers, chills, fatigue.  HEENT:    No headaches, Difficulty swallowing,Tooth/dental problems,Sore throat,  No sneezing, itching, ear ache, nasal congestion, post nasal drip,   Cardio-vascular: No chest pain,  Orthopnea, PND, swelling in lower extremities, anasarca,         dizziness, palpitations  GI:  No heartburn, indigestion, abdominal pain, nausea, vomiting, diarrhea, change in       bowel habits, loss of appetite  Resp: No excess mucus, no productive cough,  No coughing up of  blood.No change in color of mucus.No chest wall deformity  Skin:  no rash or lesions.  GU:  no dysuria, change in color of urine, no urgency or frequency.  No flank pain.  Musculoskeletal: No joint pain or swelling.  No decreased range of motion.  No back pain.  Psych: No change in mood or affect. No depression or anxiety.  No memory loss.   PHYSICAL EXAM: Blood pressure 146/70, pulse 114, temperature  98.7 F (37.1 C), temperature source Axillary, resp. rate 26, height 5\' 2"  (1.575 m), weight 85.5 kg (188 lb 7.9 oz), SpO2 96.00%.  General appearance :Awake, alert, in respiratory distress, using some accessory muscles.Speech Clear. HEENT: Atraumatic and Normocephalic, pupils equally reactive to light and accomodation Neck: supple, no JVD. No cervical lymphadenopathy.  Chest: Coarse inspiratory and expiratory rhonchi are heard in all zones. Moving air bilaterally. CVS: S1 S2 regular, no murmurs. Tachycardic Abdomen: Bowel sounds present, Non tender and not distended with no gaurding, rigidity or rebound. Extremities: B/L Lower Ext shows no edema, both legs are warm to touch Neurology: Awake alert, and oriented X 3, CN II-XII intact, Non focal Skin:No Rash Wounds:N/A  LABS ON ADMISSION:   Recent Labs  01/25/13 1320  NA 143  K 4.3  CL 109  CO2 26  GLUCOSE 102*  BUN 8  CREATININE 0.80  CALCIUM 9.5   No results found for this basename: AST, ALT, ALKPHOS, BILITOT, PROT, ALBUMIN,  in the last 72 hours No results found for this basename: LIPASE, AMYLASE,  in the last 72 hours  Recent Labs  01/25/13 1320  WBC 7.3  NEUTROABS 3.3  HGB 12.9  HCT 39.4  MCV 93.1  PLT 271   No results found for this basename: CKTOTAL, CKMB, CKMBINDEX, TROPONINI,  in the last 72 hours No results found for this basename: DDIMER,  in the last 72 hours No components found with this basename: POCBNP,    RADIOLOGIC STUDIES ON ADMISSION: Dg Chest Port 1 View  01/25/2013   *RADIOLOGY REPORT*  Clinical Data: Chest pain and shortness of breath.  PORTABLE CHEST - 1 VIEW  Comparison: 07/21/2008.  Findings: The cardiac silhouette, mediastinal and hilar contours are within normal limits and stable.  The lungs demonstrate mild peribronchial thickening and increased interstitial markings. These changes could be related to smoking or bronchitis.  No focal infiltrates or effusions.  The bony thorax is intact.   IMPRESSION: Bronchitic type lung changes could be related to smoking or bronchitis.  No definite infiltrates.   Original Report Authenticated By: Rudie Meyer, M.D.    ASSESSMENT AND PLAN: Present on Admission:  . Acute respiratory failure - Secondary to acute exacerbation of asthma. - She is in acute distress and is using some of accessory muscles, she cannot complete a full sentence- will need to place her on BiPAP for now. Patient is very anxious about BiPAP, therefore will try medicate with low-dose Ativan. -Will also check ABG, and start IV Solu-Medrol, nebulized bronchodilators. - PCCM has been consulted-spoke with Dr Sung Amabile.   . Exacerbation of  Asthma - Minimal improvement in spite of numerous rounds of nebulized albuterol given at med center [facility]. Will require BiPAP support, will start on IV Solu-Medrol, scheduled nebulized bronchodilators with Xopenex and Atrovent, will also add on budesonide via nebulized therapy.  - Repeating ABG, and have consulted PCCM-will await their input  . Generalized anxiety disorder - Since he would be on the BiPAP, we'll keep her n.p.o., will use Ativan when necessary.  Further plan will depend as patient's clinical course evolves and further radiologic and laboratory data become available. Patient will be monitored closely.  DVT Prophylaxis: Prophylactic Lovenox   Code Status: Full Code  Total time spent for admission equals 45 minutes.  [facility] Pager [phone]  If 7PM-7AM, please contact night-coverage [email or web address] Password [facility] 01/25/2013, 6:54 PM

## 2013-01-25 NOTE — ED Notes (Signed)
Report given to [facility] 

## 2013-01-25 NOTE — Progress Notes (Signed)
Pt arrived on floor at approximately 1815.  MD was paged.  Pt in active respiratory distress, vital signs are documented in epic.  Nurse contacted respiratory post report, respiratory at bedside, place pt on non rebreather.  Pt is demonstrating use of accessory muscles and complaining of left flank pain and nausea.

## 2013-01-26 ENCOUNTER — Encounter (HOSPITAL_COMMUNITY): Payer: Self-pay | Admitting: *Deleted

## 2013-01-26 DIAGNOSIS — J45901 Unspecified asthma with (acute) exacerbation: Principal | ICD-10-CM

## 2013-01-26 DIAGNOSIS — F411 Generalized anxiety disorder: Secondary | ICD-10-CM

## 2013-01-26 DIAGNOSIS — J9600 Acute respiratory failure, unspecified whether with hypoxia or hypercapnia: Secondary | ICD-10-CM

## 2013-01-26 LAB — COMPREHENSIVE METABOLIC PANEL
ALT: 22 U/L (ref 0–35)
AST: 21 U/L (ref 0–37)
Alkaline Phosphatase: 67 U/L (ref 39–117)
CO2: 18 mEq/L — ABNORMAL LOW (ref 19–32)
Calcium: 9.1 mg/dL (ref 8.4–10.5)
Chloride: 104 mEq/L (ref 96–112)
GFR calc Af Amer: >90 mL/min (ref >90)
GFR calc non Af Amer: >90 mL/min (ref >90)
Glucose, Bld: 176 mg/dL — ABNORMAL HIGH (ref 70–99)
Potassium: 4.7 mEq/L (ref 3.5–5.1)
Sodium: 137 mEq/L (ref 135–145)

## 2013-01-26 LAB — CBC
Hemoglobin: 12.1 g/dL (ref 12.0–15.0)
MCH: 30.3 pg (ref 26.0–34.0)
Platelets: 280 10*3/uL (ref 150–400)
RBC: 3.99 MIL/uL (ref 3.87–5.11)
WBC: 10.8 10*3/uL — ABNORMAL HIGH (ref 4.0–10.5)

## 2013-01-26 MED ORDER — KETOROLAC TROMETHAMINE 30 MG/ML IJ SOLN
30.0000 mg | Freq: Four times a day (QID) | INTRAMUSCULAR | Status: AC | PRN
  Administered 2013-01-30: 30 mg via INTRAVENOUS
  Filled 2013-01-26: qty 1

## 2013-01-26 MED ORDER — VILAZODONE HCL 20 MG PO TABS
20.0000 mg | ORAL_TABLET | Freq: Every day | ORAL | Status: DC
  Administered 2013-01-26 – 2013-02-02 (×8): 20 mg via ORAL
  Filled 2013-01-26 (×9): qty 1

## 2013-01-26 MED ORDER — OXYCODONE-ACETAMINOPHEN 5-325 MG PO TABS
1.0000 | ORAL_TABLET | Freq: Once | ORAL | Status: AC
  Administered 2013-01-26: 1 via ORAL
  Filled 2013-01-26: qty 1

## 2013-01-26 MED ORDER — DIAZEPAM 5 MG PO TABS
5.0000 mg | ORAL_TABLET | Freq: Four times a day (QID) | ORAL | Status: DC | PRN
  Administered 2013-01-26 – 2013-01-27 (×2): 5 mg via ORAL
  Filled 2013-01-26 (×2): qty 1

## 2013-01-26 MED ORDER — OXYCODONE-ACETAMINOPHEN 5-325 MG PO TABS
1.0000 | ORAL_TABLET | Freq: Four times a day (QID) | ORAL | Status: DC | PRN
  Administered 2013-01-26 – 2013-02-01 (×9): 1 via ORAL
  Filled 2013-01-26 (×10): qty 1

## 2013-01-26 MED ORDER — HYDROCOD POLST-CHLORPHEN POLST 10-8 MG/5ML PO LQCR
5.0000 mL | Freq: Two times a day (BID) | ORAL | Status: DC | PRN
  Administered 2013-01-26 – 2013-02-01 (×9): 5 mL via ORAL
  Filled 2013-01-26 (×9): qty 5

## 2013-01-26 MED ORDER — LEVOFLOXACIN 750 MG PO TABS
750.0000 mg | ORAL_TABLET | ORAL | Status: AC
  Administered 2013-01-26 – 2013-02-01 (×7): 750 mg via ORAL
  Filled 2013-01-26 (×8): qty 1

## 2013-01-26 MED ORDER — CYCLOBENZAPRINE HCL 10 MG PO TABS
5.0000 mg | ORAL_TABLET | Freq: Three times a day (TID) | ORAL | Status: DC | PRN
  Administered 2013-01-26 – 2013-02-01 (×9): 5 mg via ORAL
  Filled 2013-01-26 (×10): qty 1

## 2013-01-26 MED ORDER — DICLOFENAC SODIUM 1 % TD GEL
2.0000 g | Freq: Four times a day (QID) | TRANSDERMAL | Status: DC | PRN
  Administered 2013-01-26 – 2013-01-28 (×4): 2 g via TOPICAL
  Filled 2013-01-26: qty 100

## 2013-01-26 MED ORDER — MORPHINE SULFATE 2 MG/ML IJ SOLN
INTRAMUSCULAR | Status: AC
  Filled 2013-01-26: qty 1

## 2013-01-26 MED ORDER — MORPHINE SULFATE 2 MG/ML IJ SOLN
2.0000 mg | Freq: Once | INTRAMUSCULAR | Status: AC
  Administered 2013-01-26: 2 mg via INTRAVENOUS

## 2013-01-26 NOTE — Progress Notes (Signed)
PATIENT DETAILS Name: Tracey Ward Age: 42 y.o. Sex: female Date of Birth: 24-Sep-1970 Admit Date: 01/25/2013 Admitting Physician Estela Isaiah Blakes, MD PCP:No primary provider on file.  Subjective: Anxious-but breathing better when compared to yesterday. Claims she strained a muscle in the left upper flank area from coughing  Assessment/Plan: Principal Problem:  Acute respiratory failure-hypercarbic/hypoxic -better compared to yesterday -Secondary to acute exacerbation of asthma. -now on Venti-mask-titrate down FIO2 as possible  Exacerbation of Asthma -better-moving air much more today-less rhonchi as well -c/w IV solumedrol,IV Solu-Medrol, scheduled nebulized bronchodilators with Xopenex and Atrovent, and budesonide. Continue with Levaquin day # 2  -suspect anxiety playing a big role here  Generalized anxiety disorder -resume Diazepam-but at half of home dose  Disposition: Remain inpatient  DVT Prophylaxis: Prophylactic Lovenox  Code Status: Full code   Family Communication Husband at bedside  Procedures:  None  CONSULTS:  pulmonary/intensive care   MEDICATIONS: Scheduled Meds: . budesonide (PULMICORT) nebulizer solution  0.25 mg Nebulization BID  . enoxaparin (LOVENOX) injection  40 mg Subcutaneous Q24H  . levalbuterol  1.25 mg Nebulization Q6H  . levofloxacin (LEVAQUIN) IV  750 mg Intravenous Q24H  . methylPREDNISolone (SOLU-MEDROL) injection  80 mg Intravenous Q8H  . morphine      . sodium chloride  3 mL Intravenous Q12H   Continuous Infusions: . sodium chloride 75 mL/hr at 01/25/13 2007   PRN Meds:.acetaminophen, acetaminophen, chlorpheniramine-HYDROcodone, cyclobenzaprine, diazepam, levalbuterol, ondansetron (ZOFRAN) IV, ondansetron, oxyCODONE-acetaminophen  Antibiotics: Anti-infectives   Start     Dose/Rate Route Frequency Ordered Stop   01/25/13 2345  fluconazole (DIFLUCAN) IVPB 200 mg  Status:  Discontinued     200 mg 100 mL/hr over 60  Minutes Intravenous  Once 01/25/13 2334 01/26/13 0113   01/25/13 2000  levofloxacin (LEVAQUIN) IVPB 750 mg     750 mg 100 mL/hr over 90 Minutes Intravenous Every 24 hours 01/25/13 1853         PHYSICAL EXAM: Vital signs in last 24 hours: Filed Vitals:   01/26/13 0136 01/26/13 0200 01/26/13 0400 01/26/13 0636  BP:   122/54   Pulse:  108 103   Temp:   97.8 F (36.6 C)   TempSrc:   Oral   Resp:  21 19   Height:      Weight:      SpO2: 97% 96% 96% 96%    Weight change:  Filed Weights   01/25/13 1821  Weight: 85.5 kg (188 lb 7.9 oz)   Body mass index is 34.47 kg/(m^2).   Gen Exam: Awake and alert with clear speech.   Neck: Supple, No JVD.   Chest:   Good air movement, rhonchi in all lung zones CVS: S1 S2 Regular, no murmurs.  Abdomen: soft, BS +, non tender, non distended. Extremities: no edema, lower extremities warm to touch. Neurologic: Non Focal.   Skin: No Rash.   Wounds: N/A.    Intake/Output from previous day:  Intake/Output Summary (Last 24 hours) at 01/26/13 0746 Last data filed at 01/26/13 0238  Gross per 24 hour  Intake    661 ml  Output    800 ml  Net   -139 ml     LAB RESULTS: CBC  Recent Labs Lab 01/25/13 1320 01/25/13 1930 01/26/13 0600  WBC 7.3 9.9 10.8*  HGB 12.9 12.8 12.1  HCT 39.4 39.0 35.8*  PLT 271 270 280  MCV 93.1 90.9 89.7  MCH 30.5 29.8 30.3  MCHC 32.7 32.8 33.8  RDW 12.7  13.0 13.0  LYMPHSABS 2.2  --   --   MONOABS 0.5  --   --   EOSABS 1.2*  --   --   BASOSABS 0.0  --   --     Chemistries   Recent Labs Lab 01/25/13 1320 01/25/13 1930  NA 143  --   K 4.3  --   CL 109  --   CO2 26  --   GLUCOSE 102*  --   BUN 8  --   CREATININE 0.80 0.72  CALCIUM 9.5  --     CBG: No results found for this basename: GLUCAP,  in the last 168 hours  GFR Estimated Creatinine Clearance: 94.9 ml/min (by C-G formula based on Cr of 0.72).  Coagulation profile No results found for this basename: INR, PROTIME,  in the last 168  hours  Cardiac Enzymes No results found for this basename: CK, CKMB, TROPONINI, MYOGLOBIN,  in the last 168 hours  No components found with this basename: POCBNP,  No results found for this basename: DDIMER,  in the last 72 hours No results found for this basename: HGBA1C,  in the last 72 hours No results found for this basename: CHOL, HDL, LDLCALC, TRIG, CHOLHDL, LDLDIRECT,  in the last 72 hours No results found for this basename: TSH, T4TOTAL, FREET3, T3FREE, THYROIDAB,  in the last 72 hours No results found for this basename: VITAMINB12, FOLATE, FERRITIN, TIBC, IRON, RETICCTPCT,  in the last 72 hours No results found for this basename: LIPASE, AMYLASE,  in the last 72 hours  Urine Studies No results found for this basename: UACOL, UAPR, USPG, UPH, UTP, UGL, UKET, UBIL, UHGB, UNIT, UROB, ULEU, UEPI, UWBC, URBC, UBAC, CAST, CRYS, UCOM, BILUA,  in the last 72 hours  MICROBIOLOGY: Recent Results (from the past 240 hour(s))  MRSA PCR SCREENING     Status: None   Collection Time    01/25/13  6:33 PM      Result Value Range Status   MRSA by PCR NEGATIVE  NEGATIVE Final   Comment:            The GeneXpert MRSA Assay (FDA     approved for NASAL specimens     only), is one component of a     comprehensive MRSA colonization     surveillance program. It is not     intended to diagnose MRSA     infection nor to guide or     monitor treatment for     MRSA infections.    RADIOLOGY STUDIES/RESULTS: Dg Chest Port 1 View  01/25/2013   *RADIOLOGY REPORT*  Clinical Data: Chest pain and shortness of breath.  PORTABLE CHEST - 1 VIEW  Comparison: 07/21/2008.  Findings: The cardiac silhouette, mediastinal and hilar contours are within normal limits and stable.  The lungs demonstrate mild peribronchial thickening and increased interstitial markings. These changes could be related to smoking or bronchitis.  No focal infiltrates or effusions.  The bony thorax is intact.  IMPRESSION: Bronchitic type lung  changes could be related to smoking or bronchitis.  No definite infiltrates.   Original Report Authenticated By: Rudie Meyer, M.D.    Jeoffrey Massed, MD  [facility] Pager:[phone]  If 7PM-7AM, please contact night-coverage [email or web address] Password [facility] 01/26/2013, 7:46 AM   LOS: 1 day

## 2013-01-26 NOTE — ED Provider Notes (Signed)
Medical screening examination/treatment/procedure(s) were performed by non-physician practitioner and as supervising physician I was immediately available for consultation/collaboration.   Richardean Canal, MD 01/26/13 [phone]

## 2013-01-26 NOTE — Progress Notes (Signed)
PHARMACIST - PHYSICIAN COMMUNICATION DR:   Jerral Ralph CONCERNING: Antibiotic IV to Oral Route Change Policy  RECOMMENDATION: This patient is receiving Levaquin by the intravenous route.  Based on criteria approved by the Pharmacy and Therapeutics Committee, the antibiotic(s) is/are being converted to the equivalent oral dose form(s).   DESCRIPTION: These criteria include:  Patient being treated for a respiratory tract infection, urinary tract infection, cellulitis or clostridium difficile associated diarrhea if on metronidazole  The patient is not neutropenic and does not exhibit a GI malabsorption state  The patient is eating (either orally or via tube) and/or has been taking other orally administered medications for a least 24 hours  The patient is improving clinically and has a Tmax < 100.5  If you have questions about this conversion, please contact the Pharmacy Department  []   [phone] )  Tracey Ward [x]   [phone] )  Tracey Ward  []   [phone] )  [facility] []   [phone] )  Tracey Ward [facility]   [place], [place].D., BCPS Clinical Pharmacist Pager: [phone] 01/26/2013 10:30 AM

## 2013-01-26 NOTE — Progress Notes (Signed)
Nutrition Brief Note  Malnutrition Screening Tool result is inaccurate.  Please consult if nutrition needs are identified.  Katie Corry Ihnen, RD, LDN Pager #: [phone] After-Hours Pager #: [phone]  

## 2013-01-27 LAB — CBC
Hemoglobin: 12.1 g/dL (ref 12.0–15.0)
MCH: 29.9 pg (ref 26.0–34.0)
MCHC: 32.4 g/dL (ref 30.0–36.0)
RDW: 13.2 % (ref 11.5–15.5)

## 2013-01-27 LAB — BASIC METABOLIC PANEL
BUN: 14 mg/dL (ref 6–23)
Calcium: 8.8 mg/dL (ref 8.4–10.5)
GFR calc Af Amer: >90 mL/min (ref >90)
GFR calc non Af Amer: >90 mL/min (ref >90)
Glucose, Bld: 189 mg/dL — ABNORMAL HIGH (ref 70–99)
Potassium: 4.5 mEq/L (ref 3.5–5.1)
Sodium: 140 mEq/L (ref 135–145)

## 2013-01-27 MED ORDER — METHYLPREDNISOLONE SODIUM SUCC 125 MG IJ SOLR
60.0000 mg | Freq: Three times a day (TID) | INTRAMUSCULAR | Status: DC
  Administered 2013-01-27 – 2013-01-28 (×3): 60 mg via INTRAVENOUS
  Filled 2013-01-27 (×6): qty 0.96

## 2013-01-27 MED ORDER — POLYETHYLENE GLYCOL 3350 17 G PO PACK
17.0000 g | PACK | Freq: Every day | ORAL | Status: DC | PRN
Start: 2013-01-27 — End: 2013-02-02
  Filled 2013-01-27: qty 1

## 2013-01-27 MED ORDER — GUAIFENESIN ER 600 MG PO TB12
1200.0000 mg | ORAL_TABLET | Freq: Two times a day (BID) | ORAL | Status: DC | PRN
  Administered 2013-01-27 – 2013-01-30 (×5): 1200 mg via ORAL
  Filled 2013-01-27 (×5): qty 2

## 2013-01-27 MED ORDER — DIAZEPAM 5 MG PO TABS
10.0000 mg | ORAL_TABLET | Freq: Four times a day (QID) | ORAL | Status: DC | PRN
  Administered 2013-01-27 – 2013-01-29 (×5): 10 mg via ORAL
  Administered 2013-01-29: 5 mg via ORAL
  Administered 2013-01-30 – 2013-02-02 (×8): 10 mg via ORAL
  Filled 2013-01-27 (×2): qty 1
  Filled 2013-01-27: qty 2
  Filled 2013-01-27 (×2): qty 1
  Filled 2013-01-27 (×2): qty 2
  Filled 2013-01-27: qty 1
  Filled 2013-01-27: qty 2
  Filled 2013-01-27: qty 1
  Filled 2013-01-27: qty 2
  Filled 2013-01-27: qty 1
  Filled 2013-01-27: qty 2
  Filled 2013-01-27: qty 1
  Filled 2013-01-27: qty 2
  Filled 2013-01-27: qty 1
  Filled 2013-01-27: qty 2
  Filled 2013-01-27 (×2): qty 1

## 2013-01-27 NOTE — Progress Notes (Signed)
PATIENT DETAILS Name: Tracey Ward Age: 42 y.o. Sex: female Date of Birth: 20-Jun-1970 Admit Date: 01/25/2013 Admitting Physician Estela Isaiah Blakes, MD PCP:No primary provider on file.  Subjective: Slowly continues to improve-"hurts all over when I cough"-coughing spells better. Now transitioned to O2 via nasal cannula 3 L  Assessment/Plan: Principal Problem:  Acute respiratory failure-hypercarbic/hypoxic -better compared to yesterday -Secondary to acute exacerbation of asthma. -now transitioned to nasal cannula  Exacerbation of Asthma -slow improvement continues-moving air much more today-still with rhonchi as well -c/w IV solumedrol,IV Solu-Medrol, scheduled nebulized bronchodilators with Xopenex and Atrovent, and budesonide. Continue with Levaquin day # 3  -suspect anxiety playing a big role here-will resume home dosing of Valium  Generalized anxiety disorder -resume Diazepam-started at half of home dose-but now will increase to home dose of 10 mg  Disposition: Remain inpatient  DVT Prophylaxis: Prophylactic Lovenox  Code Status: Full code   Family Communication Husband at bedside  Procedures:  None  CONSULTS:  pulmonary/intensive care   MEDICATIONS: Scheduled Meds: . budesonide (PULMICORT) nebulizer solution  0.25 mg Nebulization BID  . enoxaparin (LOVENOX) injection  40 mg Subcutaneous Q24H  . levalbuterol  1.25 mg Nebulization Q6H  . levofloxacin  750 mg Oral Q24H  . methylPREDNISolone (SOLU-MEDROL) injection  60 mg Intravenous Q8H  . sodium chloride  3 mL Intravenous Q12H  . Vilazodone HCl  20 mg Oral Daily   Continuous Infusions: . sodium chloride 75 mL/hr at 01/26/13 1000   PRN Meds:.acetaminophen, acetaminophen, chlorpheniramine-HYDROcodone, cyclobenzaprine, diazepam, diclofenac sodium, guaiFENesin, ketorolac, levalbuterol, ondansetron (ZOFRAN) IV, ondansetron, oxyCODONE-acetaminophen  Antibiotics: Anti-infectives   Start     Dose/Rate  Route Frequency Ordered Stop   01/26/13 2000  levofloxacin (LEVAQUIN) tablet 750 mg     750 mg Oral Every 24 hours 01/26/13 1029     01/25/13 2345  fluconazole (DIFLUCAN) IVPB 200 mg  Status:  Discontinued     200 mg 100 mL/hr over 60 Minutes Intravenous  Once 01/25/13 2334 01/26/13 0113   01/25/13 2000  levofloxacin (LEVAQUIN) IVPB 750 mg  Status:  Discontinued     750 mg 100 mL/hr over 90 Minutes Intravenous Every 24 hours 01/25/13 1853 01/26/13 1028       PHYSICAL EXAM: Vital signs in last 24 hours: Filed Vitals:   01/26/13 2032 01/26/13 2337 01/27/13 0334 01/27/13 0721  BP:  140/85 117/70   Pulse:  106 102   Temp:  98.6 F (37 C) 98.4 F (36.9 C)   TempSrc:  Oral Oral   Resp:  19 19   Height:      Weight:   87.6 kg (193 lb 2 oz)   SpO2: 90% 93% 93% 95%    Weight change: 2.1 kg (4 lb 10.1 oz) Filed Weights   01/25/13 1821 01/27/13 0334  Weight: 85.5 kg (188 lb 7.9 oz) 87.6 kg (193 lb 2 oz)   Body mass index is 35.31 kg/(m^2).   Gen Exam: Awake and alert with clear speech.   Neck: Supple, No JVD.   Chest:   Good air movement, rhonchi in all lung zones CVS: S1 S2 Regular, no murmurs.  Abdomen: soft, BS +, non tender, non distended. Extremities: no edema, lower extremities warm to touch. Neurologic: Non Focal.   Skin: No Rash.   Wounds: N/A.    Intake/Output from previous day:  Intake/Output Summary (Last 24 hours) at 01/27/13 0740 Last data filed at 01/27/13 0200  Gross per 24 hour  Intake   1165 ml  Output   2000 ml  Net   -835 ml     LAB RESULTS: CBC  Recent Labs Lab 01/25/13 1320 01/25/13 1930 01/26/13 0600 01/27/13 0416  WBC 7.3 9.9 10.8* 18.6*  HGB 12.9 12.8 12.1 12.1  HCT 39.4 39.0 35.8* 37.4  PLT 271 270 280 307  MCV 93.1 90.9 89.7 92.3  MCH 30.5 29.8 30.3 29.9  MCHC 32.7 32.8 33.8 32.4  RDW 12.7 13.0 13.0 13.2  LYMPHSABS 2.2  --   --   --   MONOABS 0.5  --   --   --   EOSABS 1.2*  --   --   --   BASOSABS 0.0  --   --   --      Chemistries   Recent Labs Lab 01/25/13 1320 01/25/13 1930 01/26/13 0600 01/27/13 0416  NA 143  --  137 140  K 4.3  --  4.7 4.5  CL 109  --  104 105  CO2 26  --  18* 24  GLUCOSE 102*  --  176* 189*  BUN 8  --  10 14  CREATININE 0.80 0.72 0.64 0.56  CALCIUM 9.5  --  9.1 8.8    CBG: No results found for this basename: GLUCAP,  in the last 168 hours  GFR Estimated Creatinine Clearance: 96.1 ml/min (by C-G formula based on Cr of 0.56).  Coagulation profile No results found for this basename: INR, PROTIME,  in the last 168 hours  Cardiac Enzymes No results found for this basename: CK, CKMB, TROPONINI, MYOGLOBIN,  in the last 168 hours  No components found with this basename: POCBNP,  No results found for this basename: DDIMER,  in the last 72 hours No results found for this basename: HGBA1C,  in the last 72 hours No results found for this basename: CHOL, HDL, LDLCALC, TRIG, CHOLHDL, LDLDIRECT,  in the last 72 hours No results found for this basename: TSH, T4TOTAL, FREET3, T3FREE, THYROIDAB,  in the last 72 hours No results found for this basename: VITAMINB12, FOLATE, FERRITIN, TIBC, IRON, RETICCTPCT,  in the last 72 hours No results found for this basename: LIPASE, AMYLASE,  in the last 72 hours  Urine Studies No results found for this basename: UACOL, UAPR, USPG, UPH, UTP, UGL, UKET, UBIL, UHGB, UNIT, UROB, ULEU, UEPI, UWBC, URBC, UBAC, CAST, CRYS, UCOM, BILUA,  in the last 72 hours  MICROBIOLOGY: Recent Results (from the past 240 hour(s))  MRSA PCR SCREENING     Status: None   Collection Time    01/25/13  6:33 PM      Result Value Range Status   MRSA by PCR NEGATIVE  NEGATIVE Final   Comment:            The GeneXpert MRSA Assay (FDA     approved for NASAL specimens     only), is one component of a     comprehensive MRSA colonization     surveillance program. It is not     intended to diagnose MRSA     infection nor to guide or     monitor treatment for      MRSA infections.    RADIOLOGY STUDIES/RESULTS: Dg Chest Port 1 View  01/25/2013   *RADIOLOGY REPORT*  Clinical Data: Chest pain and shortness of breath.  PORTABLE CHEST - 1 VIEW  Comparison: 07/21/2008.  Findings: The cardiac silhouette, mediastinal and hilar contours are within normal limits and stable.  The lungs demonstrate mild peribronchial thickening and  increased interstitial markings. These changes could be related to smoking or bronchitis.  No focal infiltrates or effusions.  The bony thorax is intact.  IMPRESSION: Bronchitic type lung changes could be related to smoking or bronchitis.  No definite infiltrates.   Original Report Authenticated By: Rudie Meyer, M.D.    Jeoffrey Massed, MD  [facility] Pager:[phone]  If 7PM-7AM, please contact night-coverage [email or web address] Password [email or web address] 01/27/2013, 7:40 AM   LOS: 2 days

## 2013-01-28 LAB — DIFFERENTIAL
Basophils Absolute: 0 10*3/uL (ref 0.0–0.1)
Basophils Relative: 0 % (ref 0–1)
Lymphocytes Relative: 7 % — ABNORMAL LOW (ref 12–46)
Neutro Abs: 14.2 10*3/uL — ABNORMAL HIGH (ref 1.7–7.7)
Neutrophils Relative %: 89 % — ABNORMAL HIGH (ref 43–77)

## 2013-01-28 LAB — BASIC METABOLIC PANEL
Calcium: 8.9 mg/dL (ref 8.4–10.5)
GFR calc non Af Amer: >90 mL/min (ref >90)
Potassium: 4 mEq/L (ref 3.5–5.1)
Sodium: 140 mEq/L (ref 135–145)

## 2013-01-28 LAB — CBC
MCH: 30 pg (ref 26.0–34.0)
MCHC: 32.4 g/dL (ref 30.0–36.0)
Platelets: 287 10*3/uL (ref 150–400)
RBC: 4.07 MIL/uL (ref 3.87–5.11)

## 2013-01-28 MED ORDER — INSULIN ASPART 100 UNIT/ML ~~LOC~~ SOLN
0.0000 [IU] | Freq: Three times a day (TID) | SUBCUTANEOUS | Status: DC
  Administered 2013-01-29 (×3): 2 [IU] via SUBCUTANEOUS
  Administered 2013-01-30: 14:00:00 via SUBCUTANEOUS
  Administered 2013-01-30 (×2): 2 [IU] via SUBCUTANEOUS
  Administered 2013-01-31: 3 [IU] via SUBCUTANEOUS
  Administered 2013-01-31: 2 [IU] via SUBCUTANEOUS
  Administered 2013-01-31: 1 [IU] via SUBCUTANEOUS
  Administered 2013-02-01: 2 [IU] via SUBCUTANEOUS

## 2013-01-28 MED ORDER — METHYLPREDNISOLONE SODIUM SUCC 125 MG IJ SOLR
60.0000 mg | Freq: Two times a day (BID) | INTRAMUSCULAR | Status: DC
  Administered 2013-01-28 – 2013-01-29 (×2): 60 mg via INTRAVENOUS
  Filled 2013-01-28 (×4): qty 0.96

## 2013-01-28 NOTE — Progress Notes (Signed)
Inpatient Diabetes Program Recommendations  AACE/ADA: New Consensus Statement on Inpatient Glycemic Control (2013)  Target Ranges:  Prepandial:   less than 140 mg/dL      Peak postprandial:   less than 180 mg/dL (1-2 hours)      Critically ill patients:  140 - 180 mg/dL   Reason for Assessment: Elevated glucose  Note:   Results for NOEMI, BELLISSIMO (MRN [id number]) as of 01/28/2013 12:52  Ref. Range 01/25/2013 13:20 01/26/2013 06:00 01/27/2013 04:16 01/28/2013 03:35  Glucose Latest Range: 70-99 mg/dL 295 (H) 284 (H) 132 (H) 215 (H)   No history of diabetes noted.  On steroids.  Glucose levels increasing.  Request MD complete Glycemic Control Order Set:  order CBG's ac and HS, consider correction scale, order Hgb A1C, and consider changing diet order to CHO modified medium.  Thank you. Joud Pettinato S. Elsie Lincoln, RN, CNS, CDE Inpatient Diabetes Program, team pager [phone]

## 2013-01-28 NOTE — Progress Notes (Signed)
PATIENT DETAILS Name: Tracey Ward Age: 42 y.o. Sex: female Date of Birth: February 03, 1971 Admit Date: 01/25/2013 Admitting Physician Estela Isaiah Blakes, MD PCP:No primary provider on file.  Subjective: Better-less anxious with home dose of Valium-breathing better-still gets SOB/Anxiuous when she gets "coughing spells"  Assessment/Plan: Principal Problem:  Acute respiratory failure-hypercarbic/hypoxic -better-improving-decreased O2 requirement -Secondary to acute exacerbation of asthma -now transitioned to nasal cannula  Exacerbation of Asthma -slow improvement continues-moving air much more today-still with rhonchi as well -c/w IV solumedrol,IV Solu-Medrol-but will slowly decrease/taper off now, scheduled nebulized bronchodilators with Xopenex and Atrovent, and budesonide. Continue with Levaquin day # 4 -suspect anxiety playing a big role here-better with home dosing of Valium -incentive spirometry/flutter valve  Generalized anxiety disorder -resume Diazepam-started at half of home dose-but now will increase to home dose of 10 mg-better   Disposition: Remain inpatient-remain in SDU-very anxious-still getting paroxysms of SOB/Anxiety-do not think ready for floor yet  DVT Prophylaxis: Prophylactic Lovenox  Code Status: Full code   Family Communication Husband at bedside  Procedures:  None  CONSULTS:  pulmonary/intensive care   MEDICATIONS: Scheduled Meds: . budesonide (PULMICORT) nebulizer solution  0.25 mg Nebulization BID  . enoxaparin (LOVENOX) injection  40 mg Subcutaneous Q24H  . levalbuterol  1.25 mg Nebulization Q6H  . levofloxacin  750 mg Oral Q24H  . methylPREDNISolone (SOLU-MEDROL) injection  60 mg Intravenous Q12H  . sodium chloride  3 mL Intravenous Q12H  . Vilazodone HCl  20 mg Oral Daily   Continuous Infusions: . sodium chloride Stopped (01/27/13 1300)   PRN Meds:.acetaminophen, acetaminophen, chlorpheniramine-HYDROcodone, cyclobenzaprine,  diazepam, diclofenac sodium, guaiFENesin, ketorolac, levalbuterol, ondansetron (ZOFRAN) IV, ondansetron, oxyCODONE-acetaminophen, polyethylene glycol  Antibiotics: Anti-infectives   Start     Dose/Rate Route Frequency Ordered Stop   01/26/13 2000  levofloxacin (LEVAQUIN) tablet 750 mg     750 mg Oral Every 24 hours 01/26/13 1029     01/25/13 2345  fluconazole (DIFLUCAN) IVPB 200 mg  Status:  Discontinued     200 mg 100 mL/hr over 60 Minutes Intravenous  Once 01/25/13 2334 01/26/13 0113   01/25/13 2000  levofloxacin (LEVAQUIN) IVPB 750 mg  Status:  Discontinued     750 mg 100 mL/hr over 90 Minutes Intravenous Every 24 hours 01/25/13 1853 01/26/13 1028       PHYSICAL EXAM: Vital signs in last 24 hours: Filed Vitals:   01/28/13 0219 01/28/13 0414 01/28/13 0600 01/28/13 0747  BP:  153/84  131/76  Pulse:  105 95 95  Temp:  98.5 F (36.9 C)  97.9 F (36.6 C)  TempSrc:  Oral  Oral  Resp:  21 18 18   Height:      Weight:      SpO2: 96% 91% 94% 95%    Weight change:  Filed Weights   01/25/13 1821 01/27/13 0334  Weight: 85.5 kg (188 lb 7.9 oz) 87.6 kg (193 lb 2 oz)   Body mass index is 35.31 kg/(m^2).   Gen Exam: Awake and alert with clear speech.   Neck: Supple, No JVD.   Chest:   Good air movement, rhonchi in all lung zones-reduced rhonchi compared to yesterday CVS: S1 S2 Regular, no murmurs.  Abdomen: soft, BS +, non tender, non distended. Extremities: no edema, lower extremities warm to touch. Neurologic: Non Focal.   Skin: No Rash.   Wounds: N/A.    Intake/Output from previous day:  Intake/Output Summary (Last 24 hours) at 01/28/13 0854 Last data filed at 01/28/13 0758  Gross per  24 hour  Intake    123 ml  Output    400 ml  Net   -277 ml     LAB RESULTS: CBC  Recent Labs Lab 01/25/13 1320 01/25/13 1930 01/26/13 0600 01/27/13 0416 01/28/13 0335  WBC 7.3 9.9 10.8* 18.6* 16.0*  HGB 12.9 12.8 12.1 12.1 12.2  HCT 39.4 39.0 35.8* 37.4 37.7  PLT 271 270  280 307 287  MCV 93.1 90.9 89.7 92.3 92.6  MCH 30.5 29.8 30.3 29.9 30.0  MCHC 32.7 32.8 33.8 32.4 32.4  RDW 12.7 13.0 13.0 13.2 13.3  LYMPHSABS 2.2  --   --   --  1.1  MONOABS 0.5  --   --   --  0.7  EOSABS 1.2*  --   --   --  0.0  BASOSABS 0.0  --   --   --  0.0    Chemistries   Recent Labs Lab 01/25/13 1320 01/25/13 1930 01/26/13 0600 01/27/13 0416 01/28/13 0335  NA 143  --  137 140 140  K 4.3  --  4.7 4.5 4.0  CL 109  --  104 105 102  CO2 26  --  18* 24 24  GLUCOSE 102*  --  176* 189* 215*  BUN 8  --  10 14 14   CREATININE 0.80 0.72 0.64 0.56 0.57  CALCIUM 9.5  --  9.1 8.8 8.9    CBG: No results found for this basename: GLUCAP,  in the last 168 hours  GFR Estimated Creatinine Clearance: 96.1 ml/min (by C-G formula based on Cr of 0.57).  Coagulation profile No results found for this basename: INR, PROTIME,  in the last 168 hours  Cardiac Enzymes No results found for this basename: CK, CKMB, TROPONINI, MYOGLOBIN,  in the last 168 hours  No components found with this basename: POCBNP,  No results found for this basename: DDIMER,  in the last 72 hours No results found for this basename: HGBA1C,  in the last 72 hours No results found for this basename: CHOL, HDL, LDLCALC, TRIG, CHOLHDL, LDLDIRECT,  in the last 72 hours No results found for this basename: TSH, T4TOTAL, FREET3, T3FREE, THYROIDAB,  in the last 72 hours No results found for this basename: VITAMINB12, FOLATE, FERRITIN, TIBC, IRON, RETICCTPCT,  in the last 72 hours No results found for this basename: LIPASE, AMYLASE,  in the last 72 hours  Urine Studies No results found for this basename: UACOL, UAPR, USPG, UPH, UTP, UGL, UKET, UBIL, UHGB, UNIT, UROB, ULEU, UEPI, UWBC, URBC, UBAC, CAST, CRYS, UCOM, BILUA,  in the last 72 hours  MICROBIOLOGY: Recent Results (from the past 240 hour(s))  MRSA PCR SCREENING     Status: None   Collection Time    01/25/13  6:33 PM      Result Value Range Status   MRSA by  PCR NEGATIVE  NEGATIVE Final   Comment:            The GeneXpert MRSA Assay (FDA     approved for NASAL specimens     only), is one component of a     comprehensive MRSA colonization     surveillance program. It is not     intended to diagnose MRSA     infection nor to guide or     monitor treatment for     MRSA infections.    RADIOLOGY STUDIES/RESULTS: Dg Chest Port 1 View  01/25/2013   *RADIOLOGY REPORT*  Clinical Data: Chest pain and  shortness of breath.  PORTABLE CHEST - 1 VIEW  Comparison: 07/21/2008.  Findings: The cardiac silhouette, mediastinal and hilar contours are within normal limits and stable.  The lungs demonstrate mild peribronchial thickening and increased interstitial markings. These changes could be related to smoking or bronchitis.  No focal infiltrates or effusions.  The bony thorax is intact.  IMPRESSION: Bronchitic type lung changes could be related to smoking or bronchitis.  No definite infiltrates.   Original Report Authenticated By: Rudie Meyer, M.D.    Jeoffrey Massed, MD  [facility] Pager:[phone]  If 7PM-7AM, please contact night-coverage [email or web address] Password [facility] 01/28/2013, 8:54 AM   LOS: 3 days

## 2013-01-29 ENCOUNTER — Inpatient Hospital Stay (HOSPITAL_COMMUNITY): Payer: BC Managed Care – PPO

## 2013-01-29 DIAGNOSIS — D72829 Elevated white blood cell count, unspecified: Secondary | ICD-10-CM

## 2013-01-29 LAB — HEMOGLOBIN A1C
Hgb A1c MFr Bld: 6.1 % — ABNORMAL HIGH (ref <5.7)
Mean Plasma Glucose: 128 mg/dL — ABNORMAL HIGH (ref <117)

## 2013-01-29 LAB — GLUCOSE, CAPILLARY: Glucose-Capillary: 183 mg/dL — ABNORMAL HIGH (ref 70–99)

## 2013-01-29 MED ORDER — CALCIUM CARBONATE ANTACID 500 MG PO CHEW
400.0000 mg | CHEWABLE_TABLET | Freq: Three times a day (TID) | ORAL | Status: DC | PRN
  Administered 2013-01-29 – 2013-01-31 (×5): 400 mg via ORAL
  Filled 2013-01-29 (×4): qty 2
  Filled 2013-01-29: qty 1
  Filled 2013-01-29: qty 2

## 2013-01-29 MED ORDER — FLUCONAZOLE 200 MG PO TABS
200.0000 mg | ORAL_TABLET | Freq: Once | ORAL | Status: AC
Start: 2013-01-29 — End: 2013-01-29
  Administered 2013-01-29: 200 mg via ORAL
  Filled 2013-01-29: qty 1

## 2013-01-29 MED ORDER — LEVALBUTEROL HCL 1.25 MG/0.5ML IN NEBU
0.6300 mg | INHALATION_SOLUTION | Freq: Four times a day (QID) | RESPIRATORY_TRACT | Status: DC
  Filled 2013-01-29 (×4): qty 0.26

## 2013-01-29 MED ORDER — METHYLPREDNISOLONE SODIUM SUCC 40 MG IJ SOLR
40.0000 mg | Freq: Two times a day (BID) | INTRAMUSCULAR | Status: AC
  Administered 2013-01-29 – 2013-01-30 (×3): 40 mg via INTRAVENOUS
  Filled 2013-01-29 (×4): qty 1

## 2013-01-29 MED ORDER — LEVALBUTEROL HCL 0.63 MG/3ML IN NEBU
0.6300 mg | INHALATION_SOLUTION | Freq: Four times a day (QID) | RESPIRATORY_TRACT | Status: DC
  Administered 2013-01-29 – 2013-02-02 (×14): 0.63 mg via RESPIRATORY_TRACT
  Filled 2013-01-29 (×19): qty 3

## 2013-01-29 MED ORDER — LEVALBUTEROL HCL 1.25 MG/0.5ML IN NEBU
0.6300 mg | INHALATION_SOLUTION | Freq: Four times a day (QID) | RESPIRATORY_TRACT | Status: DC
  Administered 2013-01-29: 0.63 mg via RESPIRATORY_TRACT
  Filled 2013-01-29 (×4): qty 0.26

## 2013-01-29 NOTE — Progress Notes (Signed)
PATIENT DETAILS Name: Tracey Ward Age: 42 y.o. Sex: female Date of Birth: Dec 24, 1970 Admit Date: 01/25/2013 Admitting Physician Estela Isaiah Blakes, MD PCP:No primary provider on file.  Brief Summary Tracey Ward is a 42 y.o. female with a Past Medical History of bronchial asthma, anxiety and depression who presented with 3 week history of Shortness of breath, cough. Her symptoms were particularly worse 5 days prior to admission. She had seen her PCP prior to admission, and started on tapering steroids and inhalers, unfortunately this did not provide her much relief. She presented to [facility] with these symptoms, she was noted to markedly SOB, with accessory muscle use. She was then transferred to SDU for further evaluation. Her initial ABG showed a PCO2 of 37 with a normal PH, subsequent ABG showed acute respiratory acidosis with a pH of 7.3 and CO2 of 48.8. She has Anxiety disorder, and was felt that anxiety was playing a big role in some of her symptoms as well.BiPAP was offered, but because of anxiety, she refused, she was started on IV Steroids, Nebs, Levaquin, and has responded to these measures.Slowly her O2 requirements have come down, and she has clinically improved. She has been started back on Valium with better anxiety control. She still has coughing spells that worsen her anxiety and make her acutely Short of breath. It is anticipated that she will still require a few more days on inpatient hospitalization, prior to discharge.  Subjective: Improvement continues, ambulated briefly yesterday  Assessment/Plan: Principal Problem:  Acute respiratory failure-hypercarbic/hypoxic -better-improving-decreased O2 requirement -Secondary to acute exacerbation of asthma -now transitioned to nasal cannula  Exacerbation of Asthma -slow improvement continues-moving air much more today-still with rhonchi as well -c/w IV solumedrol,IV Solu-Medrol-but will slowly decrease/taper  w, scheduled nebulized bronchodilators with Xopenex and Atrovent, and budesonide. Continue with Levaquin day # 5 -suspect anxiety playing a big role here-better with home dosing of Valium -incentive spirometry/flutter valve -keeps getting these coughing spells-that make her real anxious and acutely short of breath  Generalized anxiety disorder -resume Diazepam-started at half of home dose-but now will increase to home dose of 10 mg Q6hrs prn-better   Leukocytosis -suspect secondary to steroids-trend periodically  ?DM -await A1C-mild hyperglycemia likely 2/2 to steroids  Disposition: Remain inpatient-remain in SDU-very anxious-still getting paroxysms of SOB/Anxiety-do not think ready for floor yet  DVT Prophylaxis: Prophylactic Lovenox  Code Status: Full code   Family Communication Husband at bedside  Procedures:  None  CONSULTS:  pulmonary/intensive care   MEDICATIONS: Scheduled Meds: . budesonide (PULMICORT) nebulizer solution  0.25 mg Nebulization BID  . enoxaparin (LOVENOX) injection  40 mg Subcutaneous Q24H  . insulin aspart  0-9 Units Subcutaneous TID WC  . levalbuterol  0.63 mg Nebulization Q6H  . levofloxacin  750 mg Oral Q24H  . methylPREDNISolone (SOLU-MEDROL) injection  40 mg Intravenous Q12H  . sodium chloride  3 mL Intravenous Q12H  . Vilazodone HCl  20 mg Oral Daily   Continuous Infusions: . sodium chloride Stopped (01/27/13 1300)   PRN Meds:.acetaminophen, acetaminophen, calcium carbonate, chlorpheniramine-HYDROcodone, cyclobenzaprine, diazepam, diclofenac sodium, guaiFENesin, ketorolac, levalbuterol, ondansetron (ZOFRAN) IV, ondansetron, oxyCODONE-acetaminophen, polyethylene glycol  Antibiotics: Anti-infectives   Start     Dose/Rate Route Frequency Ordered Stop   01/29/13 0245  fluconazole (DIFLUCAN) tablet 200 mg     200 mg Oral  Once 01/29/13 0239 01/29/13 0305   01/26/13 2000  levofloxacin (LEVAQUIN) tablet 750 mg     750 mg Oral Every 24  hours  01/26/13 1029     01/25/13 2345  fluconazole (DIFLUCAN) IVPB 200 mg  Status:  Discontinued     200 mg 100 mL/hr over 60 Minutes Intravenous  Once 01/25/13 2334 01/26/13 0113   01/25/13 2000  levofloxacin (LEVAQUIN) IVPB 750 mg  Status:  Discontinued     750 mg 100 mL/hr over 90 Minutes Intravenous Every 24 hours 01/25/13 1853 01/26/13 1028       PHYSICAL EXAM: Vital signs in last 24 hours: Filed Vitals:   01/29/13 0115 01/29/13 0423 01/29/13 0425 01/29/13 0831  BP: 155/90 116/81 116/81 134/87  Pulse: 108  93 71  Temp:  97.6 F (36.4 C)  97.5 F (36.4 C)  TempSrc:  Axillary  Oral  Resp: 27  16 15   Height:      Weight:  87.6 kg (193 lb 2 oz)    SpO2: 93%  94% 96%    Weight change:  Filed Weights   01/25/13 1821 01/27/13 0334 01/29/13 0423  Weight: 85.5 kg (188 lb 7.9 oz) 87.6 kg (193 lb 2 oz) 87.6 kg (193 lb 2 oz)   Body mass index is 35.31 kg/(m^2).   Gen Exam: Awake and alert with clear speech. Not using accessory muscles Neck: Supple, No JVD.   Chest:   Good air movement bilaterally, rhonchi in all lung zones-reduced rhonchi compared to yesterday CVS: S1 S2 Regular, no murmurs.  Abdomen: soft, BS +, non tender, non distended. Extremities: no edema, lower extremities warm to touch. Neurologic: Non Focal.   Skin: No Rash.   Wounds: N/A.    Intake/Output from previous day:  Intake/Output Summary (Last 24 hours) at 01/29/13 0831 Last data filed at 01/28/13 1616  Gross per 24 hour  Intake    240 ml  Output    702 ml  Net   -462 ml   LAB RESULTS: CBC  Recent Labs Lab 01/25/13 1320 01/25/13 1930 01/26/13 0600 01/27/13 0416 01/28/13 0335  WBC 7.3 9.9 10.8* 18.6* 16.0*  HGB 12.9 12.8 12.1 12.1 12.2  HCT 39.4 39.0 35.8* 37.4 37.7  PLT 271 270 280 307 287  MCV 93.1 90.9 89.7 92.3 92.6  MCH 30.5 29.8 30.3 29.9 30.0  MCHC 32.7 32.8 33.8 32.4 32.4  RDW 12.7 13.0 13.0 13.2 13.3  LYMPHSABS 2.2  --   --   --  1.1  MONOABS 0.5  --   --   --  0.7  EOSABS  1.2*  --   --   --  0.0  BASOSABS 0.0  --   --   --  0.0    Chemistries   Recent Labs Lab 01/25/13 1320 01/25/13 1930 01/26/13 0600 01/27/13 0416 01/28/13 0335  NA 143  --  137 140 140  K 4.3  --  4.7 4.5 4.0  CL 109  --  104 105 102  CO2 26  --  18* 24 24  GLUCOSE 102*  --  176* 189* 215*  BUN 8  --  10 14 14   CREATININE 0.80 0.72 0.64 0.56 0.57  CALCIUM 9.5  --  9.1 8.8 8.9    CBG: No results found for this basename: GLUCAP,  in the last 168 hours  GFR Estimated Creatinine Clearance: 96.1 ml/min (by C-G formula based on Cr of 0.57).  Coagulation profile No results found for this basename: INR, PROTIME,  in the last 168 hours  Cardiac Enzymes No results found for this basename: CK, CKMB, TROPONINI, MYOGLOBIN,  in the last 168  hours  No components found with this basename: POCBNP,  No results found for this basename: DDIMER,  in the last 72 hours No results found for this basename: HGBA1C,  in the last 72 hours No results found for this basename: CHOL, HDL, LDLCALC, TRIG, CHOLHDL, LDLDIRECT,  in the last 72 hours No results found for this basename: TSH, T4TOTAL, FREET3, T3FREE, THYROIDAB,  in the last 72 hours No results found for this basename: VITAMINB12, FOLATE, FERRITIN, TIBC, IRON, RETICCTPCT,  in the last 72 hours No results found for this basename: LIPASE, AMYLASE,  in the last 72 hours  Urine Studies No results found for this basename: UACOL, UAPR, USPG, UPH, UTP, UGL, UKET, UBIL, UHGB, UNIT, UROB, ULEU, UEPI, UWBC, URBC, UBAC, CAST, CRYS, UCOM, BILUA,  in the last 72 hours  MICROBIOLOGY: Recent Results (from the past 240 hour(s))  MRSA PCR SCREENING     Status: None   Collection Time    01/25/13  6:33 PM      Result Value Range Status   MRSA by PCR NEGATIVE  NEGATIVE Final   Comment:            The GeneXpert MRSA Assay (FDA     approved for NASAL specimens     only), is one component of a     comprehensive MRSA colonization     surveillance program.  It is not     intended to diagnose MRSA     infection nor to guide or     monitor treatment for     MRSA infections.    RADIOLOGY STUDIES/RESULTS: Dg Chest Port 1 View  01/25/2013   *RADIOLOGY REPORT*  Clinical Data: Chest pain and shortness of breath.  PORTABLE CHEST - 1 VIEW  Comparison: 07/21/2008.  Findings: The cardiac silhouette, mediastinal and hilar contours are within normal limits and stable.  The lungs demonstrate mild peribronchial thickening and increased interstitial markings. These changes could be related to smoking or bronchitis.  No focal infiltrates or effusions.  The bony thorax is intact.  IMPRESSION: Bronchitic type lung changes could be related to smoking or bronchitis.  No definite infiltrates.   Original Report Authenticated By: Rudie Meyer, M.D.    Jeoffrey Massed, MD  [facility] Pager:[phone]  If 7PM-7AM, please contact night-coverage [email or web address] Password [facility] 01/29/2013, 8:31 AM   LOS: 4 days

## 2013-01-30 LAB — BASIC METABOLIC PANEL
Calcium: 8.6 mg/dL (ref 8.4–10.5)
Creatinine, Ser: 0.67 mg/dL (ref 0.50–1.10)
GFR calc Af Amer: >90 mL/min (ref >90)

## 2013-01-30 LAB — GLUCOSE, CAPILLARY
Glucose-Capillary: 199 mg/dL — ABNORMAL HIGH (ref 70–99)
Glucose-Capillary: 160 mg/dL — ABNORMAL HIGH (ref 70–99)

## 2013-01-30 LAB — CBC
MCH: 30.5 pg (ref 26.0–34.0)
MCHC: 33.5 g/dL (ref 30.0–36.0)
MCV: 91 fL (ref 78.0–100.0)
Platelets: 300 10*3/uL (ref 150–400)
RDW: 12.9 % (ref 11.5–15.5)

## 2013-01-30 MED ORDER — GI COCKTAIL ~~LOC~~
30.0000 mL | Freq: Three times a day (TID) | ORAL | Status: DC | PRN
  Administered 2013-01-30: 30 mL via ORAL
  Filled 2013-01-30: qty 30

## 2013-01-30 MED ORDER — METHYLPHENIDATE HCL 10 MG PO TABS: 10.0000 mg | ORAL_TABLET | ORAL | Status: DC

## 2013-01-30 MED ORDER — BENZONATATE 100 MG PO CAPS
200.0000 mg | ORAL_CAPSULE | Freq: Three times a day (TID) | ORAL | Status: DC
  Administered 2013-01-30 – 2013-02-02 (×9): 200 mg via ORAL
  Filled 2013-01-30 (×11): qty 2

## 2013-01-30 MED ORDER — PREDNISONE 20 MG PO TABS
40.0000 mg | ORAL_TABLET | Freq: Every day | ORAL | Status: DC
  Administered 2013-01-31 – 2013-02-01 (×2): 40 mg via ORAL
  Filled 2013-01-30 (×4): qty 2

## 2013-01-30 MED ORDER — PANTOPRAZOLE SODIUM 40 MG PO TBEC
40.0000 mg | DELAYED_RELEASE_TABLET | Freq: Two times a day (BID) | ORAL | Status: DC
  Administered 2013-01-30 – 2013-02-02 (×6): 40 mg via ORAL
  Filled 2013-01-30 (×6): qty 1

## 2013-01-30 NOTE — Progress Notes (Signed)
[facility] Progress Note [place] TEAM 1 - Stepdown/ICU TEAM   Tracey Ward JYN:[id number] DOB: 15-Jun-1970 DOA: 01/25/2013 PCP: No primary provider on file.  Brief narrative: 42 y.o. female with a Past Medical History of bronchial asthma, anxiety and depression who presented with 3 week history of Shortness of breath, cough. Her symptoms were particularly worse 5 days prior to admission. She had seen her PCP prior to admission, and started on tapering steroids and inhalers, unfortunately this did not provide her much relief. She presented to [facility] with these symptoms, she was noted tobe  markedly SOB, with accessory muscle use. She was then transferred to SDU for further evaluation. Her initial ABG showed a PCO2 of 37 with a normal PH, subsequent ABG showed acute respiratory acidosis with a pH of 7.3 and CO2 of 48.8. She has Anxiety disorder, and was felt that anxiety was playing a big role in some of her symptoms.  BiPAP was offered, but because of anxiety, she refused.  She was started on IV Steroids, Nebs, Levaquin, and has responded to these measures. Slowly her O2 requirements have come down, and she has clinically improved. She has been started back on Valium with better anxiety control. She still has coughing spells that worsen her anxiety and make her acutely Short of breath. It is anticipated that she will still require a few more days on inpatient hospitalization, prior to discharge.   Assessment/Plan:  Acute respiratory failure - hypercarbic/hypoxic - due to exacerbation of Asthma keeps getting coughing spells that make her real anxious and acutely short of breath - ?if coughing is a component of GERD - will provide w/ GERD tx - follow sx - cont to manage anxiety w/ home benzo - slowly taper steroids, as wheezing continues on exam today   Generalized anxiety disorder Diazepam at home dose of 10 mg Q6hrs prn  Depression Has required admit to [facility] in remote past -  no evidence to suggest severe depression at this time   Hyperglycemia - steroid induced A1c does NOT meet criteria for DM diagnosis - taper steroids and follow CBG trend   Code Status: FULL Family Communication: no family present at time of exam today  Disposition Plan: SDU - possible transfer to floor 8/21 if SOB improved   Consultants: PCCM  Procedures: none  Antibiotics: Levaquin 8/15 >>  Diflucan 8/18  DVT prophylaxis: lovenox  HPI/Subjective: Pt is sitting up in bed c/o ongoing wheezing as well as R sided chest wall pain associated with her wheezing.  Also c/o some centralized chest discomfort.  Describes pain as sharp and stabbing, but is not made worse with deep breath.  Objective: Blood pressure 178/89, pulse 80, temperature 98.3 F (36.8 C), temperature source Oral, resp. rate 17, height 5\' 2"  (1.575 m), weight 87.6 kg (193 lb 2 oz), SpO2 100.00%.  Intake/Output Summary (Last 24 hours) at 01/30/13 1434 Last data filed at 01/29/13 2200  Gross per 24 hour  Intake    150 ml  Output      0 ml  Net    150 ml   Exam: General: mild persistent resp distress w/ audible wheezing from bedside Lungs: diffuse exp wheeze w/o focal crackles appreciated - good air movement th/o Cardiovascular: Regular rate and rhythm without murmur gallop or rub normal S1 and S2 Abdomen: Nontender, nondistended, soft, bowel sounds positive, no rebound, no ascites, no appreciable mass Extremities: No significant cyanosis, clubbing, or edema bilateral lower extremities  Data Reviewed:  Basic Metabolic Panel:  Recent Labs Lab 01/25/13 1320 01/25/13 1930 01/26/13 0600 01/27/13 0416 01/28/13 0335 01/30/13 0500  NA 143  --  137 140 140 140  K 4.3  --  4.7 4.5 4.0 4.3  CL 109  --  104 105 102 102  CO2 26  --  18* 24 24 30   GLUCOSE 102*  --  176* 189* 215* 128*  BUN 8  --  10 14 14 20   CREATININE 0.80 0.72 0.64 0.56 0.57 0.67  CALCIUM 9.5  --  9.1 8.8 8.9 8.6   Liver Function  Tests:  Recent Labs Lab 01/26/13 0600  AST 21  ALT 22  ALKPHOS 67  BILITOT 0.1*  PROT 6.1  ALBUMIN 3.3*   CBC:  Recent Labs Lab 01/25/13 1320 01/25/13 1930 01/26/13 0600 01/27/13 0416 01/28/13 0335 01/30/13 0500  WBC 7.3 9.9 10.8* 18.6* 16.0* 13.9*  NEUTROABS 3.3  --   --   --  14.2*  --   HGB 12.9 12.8 12.1 12.1 12.2 12.8  HCT 39.4 39.0 35.8* 37.4 37.7 38.2  MCV 93.1 90.9 89.7 92.3 92.6 91.0  PLT 271 270 280 307 287 300   CBG:  Recent Labs Lab 01/29/13 1218 01/29/13 1713 01/29/13 2040 01/30/13 0747 01/30/13 1230  GLUCAP 160* 183* 188* 184* 199*    Recent Results (from the past 240 hour(s))  MRSA PCR SCREENING     Status: None   Collection Time    01/25/13  6:33 PM      Result Value Range Status   MRSA by PCR NEGATIVE  NEGATIVE Final   Comment:            The GeneXpert MRSA Assay (FDA     approved for NASAL specimens     only), is one component of a     comprehensive MRSA colonization     surveillance program. It is not     intended to diagnose MRSA     infection nor to guide or     monitor treatment for     MRSA infections.     Studies:  Recent x-ray studies have been reviewed in detail by the Attending Physician  Scheduled Meds:  Scheduled Meds: . budesonide (PULMICORT) nebulizer solution  0.25 mg Nebulization BID  . enoxaparin (LOVENOX) injection  40 mg Subcutaneous Q24H  . insulin aspart  0-9 Units Subcutaneous TID WC  . levalbuterol  0.63 mg Nebulization Q6H  . levofloxacin  750 mg Oral Q24H  . methylPREDNISolone (SOLU-MEDROL) injection  40 mg Intravenous Q12H  . sodium chloride  3 mL Intravenous Q12H  . Vilazodone HCl  20 mg Oral Daily    Time spent on care of this patient:   [facility] T  [facility] Office  [phone] Pager - Text Page per Loretha Stapler as per below:  On-Call/Text Page:      [email or web address]      password [email or web address]  If 7PM-7AM, please contact night-coverage [email or web address] Password [facility] 01/30/2013, 2:34  PM   LOS: 5 days

## 2013-01-30 NOTE — Progress Notes (Signed)
Inpatient Diabetes Program Recommendations  AACE/ADA: New Consensus Statement on Inpatient Glycemic Control (2013)  Target Ranges:  Prepandial:   less than 140 mg/dL      Peak postprandial:   less than 180 mg/dL (1-2 hours)      Critically ill patients:  140 - 180 mg/dL   hyperglycemia while on steroid therapy.  Inpatient Diabetes Program Recommendations HgbA1C: 6.1%-however this is influenced by the recent glucose. Diet: Please add carbohydrate modified to diet orders while on steroid therapy Thank you, Lenor Coffin, RN, CNS, Diabetes Coordinator [phone])

## 2013-01-31 DIAGNOSIS — R0902 Hypoxemia: Secondary | ICD-10-CM

## 2013-01-31 LAB — GLUCOSE, CAPILLARY
Glucose-Capillary: 150 mg/dL — ABNORMAL HIGH (ref 70–99)
Glucose-Capillary: 152 mg/dL — ABNORMAL HIGH (ref 70–99)
Glucose-Capillary: 207 mg/dL — ABNORMAL HIGH (ref 70–99)
Glucose-Capillary: 133 mg/dL — ABNORMAL HIGH (ref 70–99)

## 2013-01-31 NOTE — Progress Notes (Signed)
[facility] Progress Note [place] TEAM 1 - Stepdown/ICU TEAM   Tracey Ward YNW:[id number] DOB: 09-Jul-1970 DOA: 01/25/2013 PCP: No primary provider on file.  Brief narrative: 42 y.o. female with a Past Medical History of bronchial asthma, anxiety and depression who presented with 3 week history of Shortness of breath, cough. Her symptoms were particularly worse 5 days prior to admission. She had seen her PCP prior to admission, and started on tapering steroids and inhalers, unfortunately this did not provide her much relief. She presented to [facility] with these symptoms, she was noted tobe  markedly SOB, with accessory muscle use. She was then transferred to SDU for further evaluation. Her initial ABG showed a PCO2 of 37 with a normal PH, subsequent ABG showed acute respiratory acidosis with a pH of 7.3 and CO2 of 48.8. She has Anxiety disorder, and was felt that anxiety was playing a big role in some of her symptoms.  BiPAP was offered, but because of anxiety, she refused.  She was started on IV Steroids, Nebs, Levaquin, and has responded to these measures. Slowly her O2 requirements have come down, and she has clinically improved. She has been started back on Valium with better anxiety control. She still has coughing spells that worsen her anxiety and make her acutely Short of breath. It is anticipated that she will still require a few more days on inpatient hospitalization, prior to discharge.   Assessment/Plan:  Acute respiratory failure - hypercarbic/hypoxic - due to exacerbation of Asthma keeps getting coughing spells that make her real anxious and acutely short of breath - ?if coughing is a component of GERD - will provide w/ GERD tx - follow sx - cont to manage anxiety w/ home benzo - will not taper steroids today as she is having further difficult breathing at this time- follow in SDU  Generalized anxiety disorder Diazepam at home dose of 10 mg Q6hrs prn- giving a dose  of Valium now due to current distress  Depression Has required admit to [facility] in remote past - no evidence to suggest severe depression at this time   Hyperglycemia - steroid induced A1c does NOT meet criteria for DM diagnosis -   Code Status: FULL Family Communication: spoke with husband at bedtime.  Disposition Plan: SDU -   Consultants: PCCM  Procedures: none  Antibiotics: Levaquin 8/15 >>  Diflucan 8/18  DVT prophylaxis: lovenox  HPI/Subjective: Pt noted to be sitting up in bed with difficult breathing- states she was doing well - even ambulated in the [place] yesterday but upon going to the bathroom today, she became severe short of breath. Has tightness in her chest and feel like her "heart hurts".   Objective: Blood pressure 140/91, pulse 68, temperature 97.6 F (36.4 C), temperature source Oral, resp. rate 15, height 5\' 2"  (1.575 m), weight 90 kg (198 lb 6.6 oz), SpO2 99.00%.  Intake/Output Summary (Last 24 hours) at 01/31/13 1527 Last data filed at 01/31/13 1150  Gross per 24 hour  Intake      0 ml  Output   1000 ml  Net  -1000 ml   Exam: General: moderate resp distress w/ audible wheezing from bedside Lungs: diffuse exp wheeze w/o focal crackles appreciated -  Cardiovascular: Regular rate and rhythm without murmur gallop or rub normal S1 and S2 Abdomen: Nontender, nondistended, soft, bowel sounds positive, no rebound, no ascites, no appreciable mass Extremities: No significant cyanosis, clubbing, or edema bilateral lower extremities  Data Reviewed: Basic  Metabolic Panel:  Recent Labs Lab 01/25/13 1320 01/25/13 1930 01/26/13 0600 01/27/13 0416 01/28/13 0335 01/30/13 0500  NA 143  --  137 140 140 140  K 4.3  --  4.7 4.5 4.0 4.3  CL 109  --  104 105 102 102  CO2 26  --  18* 24 24 30   GLUCOSE 102*  --  176* 189* 215* 128*  BUN 8  --  10 14 14 20   CREATININE 0.80 0.72 0.64 0.56 0.57 0.67  CALCIUM 9.5  --  9.1 8.8 8.9 8.6   Liver Function  Tests:  Recent Labs Lab 01/26/13 0600  AST 21  ALT 22  ALKPHOS 67  BILITOT 0.1*  PROT 6.1  ALBUMIN 3.3*   CBC:  Recent Labs Lab 01/25/13 1320 01/25/13 1930 01/26/13 0600 01/27/13 0416 01/28/13 0335 01/30/13 0500  WBC 7.3 9.9 10.8* 18.6* 16.0* 13.9*  NEUTROABS 3.3  --   --   --  14.2*  --   HGB 12.9 12.8 12.1 12.1 12.2 12.8  HCT 39.4 39.0 35.8* 37.4 37.7 38.2  MCV 93.1 90.9 89.7 92.3 92.6 91.0  PLT 271 270 280 307 287 300   CBG:  Recent Labs Lab 01/30/13 1230 01/30/13 1722 01/30/13 2042 01/31/13 0817 01/31/13 1147  GLUCAP 199* 160* 150* 152* 207*    Recent Results (from the past 240 hour(s))  MRSA PCR SCREENING     Status: None   Collection Time    01/25/13  6:33 PM      Result Value Range Status   MRSA by PCR NEGATIVE  NEGATIVE Final   Comment:            The GeneXpert MRSA Assay (FDA     approved for NASAL specimens     only), is one component of a     comprehensive MRSA colonization     surveillance program. It is not     intended to diagnose MRSA     infection nor to guide or     monitor treatment for     MRSA infections.     Studies:  Recent x-ray studies have been reviewed in detail by the Attending Physician  Scheduled Meds:  Scheduled Meds: . benzonatate  200 mg Oral TID  . budesonide (PULMICORT) nebulizer solution  0.25 mg Nebulization BID  . enoxaparin (LOVENOX) injection  40 mg Subcutaneous Q24H  . insulin aspart  0-9 Units Subcutaneous TID WC  . levalbuterol  0.63 mg Nebulization Q6H  . levofloxacin  750 mg Oral Q24H  . pantoprazole  40 mg Oral BID AC  . predniSONE  40 mg Oral Q breakfast  . sodium chloride  3 mL Intravenous Q12H  . Vilazodone HCl  20 mg Oral Daily    Time spent on care of this patient:   Calvert Cantor, MD  [facility] Office  [phone] Pager - Text Page per Loretha Stapler as per below:  On-Call/Text Page:      [email or web address]      password [email or web address]  If 7PM-7AM, please contact  night-coverage [email or web address] Password [facility] 01/31/2013, 3:27 PM   LOS: 6 days

## 2013-02-01 LAB — GLUCOSE, CAPILLARY
Glucose-Capillary: 172 mg/dL — ABNORMAL HIGH (ref 70–99)
Glucose-Capillary: 137 mg/dL — ABNORMAL HIGH (ref 70–99)

## 2013-02-01 MED ORDER — PREDNISONE 20 MG PO TABS
20.0000 mg | ORAL_TABLET | Freq: Every day | ORAL | Status: DC
  Administered 2013-02-02: 20 mg via ORAL
  Filled 2013-02-01 (×2): qty 1

## 2013-02-01 MED ORDER — ALUM & MAG HYDROXIDE-SIMETH 200-200-20 MG/5ML PO SUSP
30.0000 mL | ORAL | Status: DC | PRN
  Filled 2013-02-01: qty 30

## 2013-02-01 MED ORDER — CALCIUM CARBONATE ANTACID 500 MG PO CHEW
1.0000 | CHEWABLE_TABLET | Freq: Three times a day (TID) | ORAL | Status: DC | PRN
  Administered 2013-02-01: 200 mg via ORAL
  Filled 2013-02-01 (×2): qty 1

## 2013-02-01 NOTE — Progress Notes (Signed)
[facility] Progress Note [place] TEAM 1 - Stepdown/ICU TEAM   CASSUNDRA MCKEEVER ZOX:[id number] DOB: 20-Mar-1971 DOA: 01/25/2013 PCP: No primary provider on file.  Brief narrative: 42 y.o. female with history of bronchial asthma, anxiety and depression who presented with 3 week history of shortness of breath, cough. Her symptoms were particularly worse 5 days prior to admission. She had seen her PCP prior to admission, and started on tapering steroids and inhalers, unfortunately this did not provide her much relief. She presented to [facility] with these symptoms, she was noted to be markedly SOB, with accessory muscle use. She was then transferred to SDU for further evaluation. Her initial ABG showed a PCO2 of 37 with a normal PH, subsequent ABG showed acute respiratory acidosis with a pH of 7.3 and CO2 of 48.8. She has anxiety disorder, and it was felt that anxiety was playing a big role in some of her symptoms.  BiPAP was offered, but because of anxiety, she refused.  She was started on IV Steroids, Nebs, Levaquin. Slowly her O2 requirements have come down, and she has clinically improved. She has been started back on Valium with better anxiety control. She still has coughing spells that worsen her anxiety and make her acutely Short of breath. It is anticipated that she will still require a few more days on inpatient hospitalization, prior to discharge.   Assessment/Plan:  Acute respiratory failure - hypercarbic/hypoxic - due to exacerbation of Asthma keeps getting coughing spells that make her real anxious and acutely short of breath - ?if coughing is a component of GERD - provided w/ GERD tx - follow sx - cont to manage anxiety w/ home benzo - will resume slow steroid taper - add flutter valve  Generalized anxiety disorder Diazepam at home dose of 10 mg Q6hrs prn  Depression Has required admit to [facility] in remote past - no evidence to suggest severe depression at this time    Hyperglycemia - steroid induced A1c does NOT meet criteria for DM diagnosis   Code Status: FULL Family Communication: spoke with husband at bedside Disposition Plan: SDU - avoid transfer to minimize opportunity to exacerbate anxiety - hopeful for d/c home in am   Consultants: PCCM  Procedures: none  Antibiotics: Levaquin 8/15 >> 8/22 Diflucan 8/18  DVT prophylaxis: lovenox  HPI/Subjective: Has begun to expectorate sputum.  Some yellowish and green discoloration.  Feels that wheezing has improved further.   Objective: Blood pressure 116/65, pulse 90, temperature 97.5 F (36.4 C), temperature source Oral, resp. rate 20, height 5\' 2"  (1.575 m), weight 90 kg (198 lb 6.6 oz), SpO2 97.00%.  Intake/Output Summary (Last 24 hours) at 02/01/13 1640 Last data filed at 02/01/13 1400  Gross per 24 hour  Intake 1393.75 ml  Output   1675 ml  Net -281.25 ml   Exam: General: moderate resp distress w/ audible wheezing from bedside Lungs: diffuse exp wheeze w/o focal crackles appreciated - improved since my exam 48hrs prior  Cardiovascular: Regular rate and rhythm without murmur gallop or rub normal S1 and S2 Abdomen: Nontender, nondistended, soft, bowel sounds positive, no rebound, no ascites, no appreciable mass Extremities: No significant cyanosis, clubbing, or edema bilateral lower extremities  Data Reviewed: Basic Metabolic Panel:  Recent Labs Lab 01/26/13 0600 01/27/13 0416 01/28/13 0335 01/30/13 0500 02/01/13 0517  NA 137 140 140 140  --   K 4.7 4.5 4.0 4.3  --   CL 104 105 102 102  --  CO2 18* 24 24 30   --   GLUCOSE 176* 189* 215* 128*  --   BUN 10 14 14 20   --   CREATININE 0.64 0.56 0.57 0.67 0.64  CALCIUM 9.1 8.8 8.9 8.6  --    Liver Function Tests:  Recent Labs Lab 01/26/13 0600  AST 21  ALT 22  ALKPHOS 67  BILITOT 0.1*  PROT 6.1  ALBUMIN 3.3*   CBC:  Recent Labs Lab 01/25/13 1930 01/26/13 0600 01/27/13 0416 01/28/13 0335 01/30/13 0500   WBC 9.9 10.8* 18.6* 16.0* 13.9*  NEUTROABS  --   --   --  14.2*  --   HGB 12.8 12.1 12.1 12.2 12.8  HCT 39.0 35.8* 37.4 37.7 38.2  MCV 90.9 89.7 92.3 92.6 91.0  PLT 270 280 307 287 300   CBG:  Recent Labs Lab 01/31/13 1147 01/31/13 1628 01/31/13 2051 02/01/13 0727 02/01/13 1133  GLUCAP 207* 133* 163* 111* 172*    Recent Results (from the past 240 hour(s))  MRSA PCR SCREENING     Status: None   Collection Time    01/25/13  6:33 PM      Result Value Range Status   MRSA by PCR NEGATIVE  NEGATIVE Final   Comment:            The GeneXpert MRSA Assay (FDA     approved for NASAL specimens     only), is one component of a     comprehensive MRSA colonization     surveillance program. It is not     intended to diagnose MRSA     infection nor to guide or     monitor treatment for     MRSA infections.     Studies:  Recent x-ray studies have been reviewed in detail by the Attending Physician  Scheduled Meds:  Scheduled Meds: . benzonatate  200 mg Oral TID  . budesonide (PULMICORT) nebulizer solution  0.25 mg Nebulization BID  . enoxaparin (LOVENOX) injection  40 mg Subcutaneous Q24H  . insulin aspart  0-9 Units Subcutaneous TID WC  . levalbuterol  0.63 mg Nebulization Q6H  . levofloxacin  750 mg Oral Q24H  . pantoprazole  40 mg Oral BID AC  . predniSONE  40 mg Oral Q breakfast  . sodium chloride  3 mL Intravenous Q12H  . Vilazodone HCl  20 mg Oral Daily    Time spent on care of this patient:   Lonia Blood, MD  [facility] Office  [phone] Pager - Text Page per Amion as per below:  On-Call/Text Page:      [email or web address]      password [email or web address]  If 7PM-7AM, please contact night-coverage [email or web address] Password [facility] 02/01/2013, 4:40 PM   LOS: 7 days

## 2013-02-02 MED ORDER — LEVALBUTEROL TARTRATE 45 MCG/ACT IN AERO: 1.0000 | INHALATION_SPRAY | Freq: Four times a day (QID) | RESPIRATORY_TRACT | Status: DC | PRN

## 2013-02-02 MED ORDER — FLUTICASONE PROPIONATE HFA 44 MCG/ACT IN AERO
2.0000 | INHALATION_SPRAY | Freq: Two times a day (BID) | RESPIRATORY_TRACT | Status: DC
  Filled 2013-02-02 (×2): qty 10.6

## 2013-02-02 MED ORDER — PREDNISONE 10 MG PO TABS: ORAL_TABLET | ORAL | Status: DC

## 2013-02-02 MED ORDER — FLUTICASONE PROPIONATE HFA 44 MCG/ACT IN AERO: 2.0000 | INHALATION_SPRAY | Freq: Two times a day (BID) | RESPIRATORY_TRACT | Status: AC

## 2013-02-02 MED ORDER — LEVALBUTEROL HCL 1.25 MG/0.5ML IN NEBU: 0.6300 mg | INHALATION_SOLUTION | RESPIRATORY_TRACT | Status: AC | PRN

## 2013-02-02 MED ORDER — BENZONATATE 200 MG PO CAPS: 200.0000 mg | ORAL_CAPSULE | Freq: Three times a day (TID) | ORAL | Status: AC

## 2013-02-02 MED ORDER — LEVALBUTEROL TARTRATE 45 MCG/ACT IN AERO: 1.0000 | INHALATION_SPRAY | Freq: Four times a day (QID) | RESPIRATORY_TRACT | Status: AC | PRN

## 2013-02-02 MED ORDER — OXYCODONE-ACETAMINOPHEN 5-325 MG PO TABS: 1.0000 | ORAL_TABLET | Freq: Four times a day (QID) | ORAL | Status: DC | PRN

## 2013-02-02 MED ORDER — LEVALBUTEROL HCL 0.63 MG/3ML IN NEBU: 0.6300 mg | INHALATION_SOLUTION | Freq: Four times a day (QID) | RESPIRATORY_TRACT | Status: DC

## 2013-02-02 MED ORDER — PANTOPRAZOLE SODIUM 40 MG PO TBEC: 40.0000 mg | DELAYED_RELEASE_TABLET | Freq: Two times a day (BID) | ORAL | Status: AC

## 2013-02-02 MED ORDER — LEVALBUTEROL HCL 1.25 MG/0.5ML IN NEBU
0.6300 mg | INHALATION_SOLUTION | RESPIRATORY_TRACT | Status: DC | PRN
  Filled 2013-02-02: qty 0.26

## 2013-02-02 NOTE — Discharge Summary (Signed)
DISCHARGE SUMMARY  Tracey Ward  MR#: [id number]  DOB:09-11-1970  Date of Admission: 01/25/2013 Date of Discharge: 02/02/2013  Attending Physician:Jontavia Leatherbury T  Patient's PCP: Dr. [name] Deboraha Sprang FP at [facility]  Consults:  PCCM/Pulmonary Med  Disposition: D/C Home w/ husband   Follow-up Appts:     Follow-up Information   Follow up with [name], MD. Schedule an appointment as soon as possible for a visit in 3 days.   Specialty:  Family Medicine   Contact information:   [address] [place] [address] [phone]      Discharge Diagnoses: Acute respiratory failure - hypercarbic/hypoxic - due to exacerbation of Asthma  Generalized anxiety disorder  Depression  Hyperglycemia - steroid induced   Initial presentation: 42 y.o. female with history of bronchial asthma, anxiety and depression who presented with 3 week history of shortness of breath and cough. Her symptoms were particularly worse 5 days prior to admission. She had seen her PCP prior to admission, and was started on tapering steroids and inhalers, unfortunately this did not provide her much relief. She presented to [facility] with these symptoms, she was noted to be markedly SOB, with accessory muscle use. She was then transferred to SDU for further evaluation. Her initial ABG showed a PCO2 of 37 with a normal PH, subsequent ABG showed acute respiratory acidosis with a pH of 7.3 and CO2 of 48.8. She has anxiety disorder, and it was felt that anxiety was playing a big role in some of her symptoms. BiPAP was offered, but because of anxiety, she refused. She was started on IV Steroids, Nebs, Levaquin.   Slowly her O2 requirements have come down, and she has clinically improved. She has been started back on Valium with better anxiety control. She still has coughing spells that worsen her anxiety and make her acutely short of breath.  Hospital Course:  Acute respiratory failure -  hypercarbic/hypoxic - due to exacerbation of Asthma  keeps getting coughing spells that make her real anxious and acutely short of breath - ?if coughing is a component of GERD - provided w/ GERD tx - cont to manage anxiety w/ home benzo - has displayed slow but consitent progress over last 48hrs - now able to ambulate around unit without exacerbation of her wheezing - is requesting d/c home - appears stable for same - educated on need for close outpt f/u   Generalized anxiety disorder  Diazepam at home dose of 10 mg Q6hrs prn - well controlled at time of d/c   Depression  Has required admit to [facility] in remote past - no evidence to suggest severe depression at this time   Hyperglycemia - steroid induced  A1c does NOT meet criteria for DM diagnosis     Medication List    STOP taking these medications       albuterol (2.5 MG/3ML) 0.083% nebulizer solution  Commonly known as:  PROVENTIL     methylphenidate 10 MG tablet  Commonly known as:  RITALIN     PROAIR HFA 108 (90 BASE) MCG/ACT inhaler  Generic drug:  albuterol      TAKE these medications       benzonatate 200 MG capsule  Commonly known as:  TESSALON  Take 1 capsule (200 mg total) by mouth 3 (three) times daily.     diazepam 10 MG tablet  Commonly known as:  VALIUM  Take 10 mg by mouth every 6 (six) hours as needed for  anxiety.     diphenhydrAMINE 25 MG tablet  Commonly known as:  BENADRYL  Take 25 mg by mouth 2 (two) times daily as needed (congestion).     fluticasone 44 MCG/ACT inhaler  Commonly known as:  FLOVENT HFA  Inhale 2 puffs into the lungs 2 (two) times daily.     guaiFENesin 600 MG 12 hr tablet  Commonly known as:  MUCINEX  Take 1,200 mg by mouth 2 (two) times daily as needed for congestion.     ibuprofen 200 MG tablet  Commonly known as:  ADVIL,MOTRIN  Take 800 mg by mouth 3 (three) times daily as needed for pain.     levalbuterol 0.63 MG/3ML nebulizer solution  Commonly known as:  XOPENEX  Take 3  mLs (0.63 mg total) by nebulization every 6 (six) hours.     levalbuterol 1.25 MG/0.5ML nebulizer solution  Commonly known as:  XOPENEX  Take 0.63 mg by nebulization every 3 (three) hours as needed for wheezing or shortness of breath.     levalbuterol 45 MCG/ACT inhaler  Commonly known as:  XOPENEX HFA  Inhale 1-2 puffs into the lungs every 6 (six) hours as needed for wheezing or shortness of breath.     oxyCODONE-acetaminophen 5-325 MG per tablet  Commonly known as:  PERCOCET/ROXICET  Take 1 tablet by mouth every 6 (six) hours as needed.     pantoprazole 40 MG tablet  Commonly known as:  PROTONIX  Take 1 tablet (40 mg total) by mouth 2 (two) times daily before a meal.     predniSONE 10 MG tablet  Commonly known as:  DELTASONE  Take 2 tablets a day for 3 days starting 8/24 - then take one tablet a day for 4 days - then stop     VIIBRYD 20 MG Tabs  Generic drug:  Vilazodone HCl  Take 20 mg by mouth daily.       Day of Discharge BP 118/84  Pulse 83  Temp(Src) 97.8 F (36.6 C) (Oral)  Resp 23  Ht 5\' 2"  (1.575 m)  Wt 90 kg (198 lb 6.6 oz)  BMI 36.28 kg/m2  SpO2 97%  Physical Exam: General: No acute respiratory distress Lungs: Clear to auscultation bilaterally with only mild exp wheezing but good air movement th/o all fields and no prolongation of exhalation  Cardiovascular: Regular rate and rhythm without murmur gallop or rub normal S1 and S2 Abdomen: Nontender, nondistended, soft, bowel sounds positive, no rebound, no ascites, no appreciable mass - overweight  Extremities: No significant cyanosis, clubbing, or edema bilateral lower extremities  Time spent in discharge (includes decision making & examination of pt): >35 minutes  02/02/2013, 10:40 AM   Lonia Blood, MD [facility] Office  [phone] Pager [phone]  On-Call/Text Page:      [email or web address]      password [facility]

## 2013-02-02 NOTE — Progress Notes (Signed)
Discharge instructions reviewed with patient and husband. Both verbalized understanding. Patient d/c via wheelchair

## 2013-02-17 ENCOUNTER — Emergency Department (HOSPITAL_COMMUNITY)
Admission: EM | Admit: 2013-02-17 | Discharge: 2013-02-17 | Disposition: A | Discharge status: Home / Self Care | Payer: BC Managed Care – PPO | Attending: Emergency Medicine | Admitting: Emergency Medicine

## 2013-02-17 ENCOUNTER — Encounter (HOSPITAL_COMMUNITY): Payer: Self-pay | Admitting: *Deleted

## 2013-02-17 DIAGNOSIS — Z79899 Other long term (current) drug therapy: Secondary | ICD-10-CM | POA: Insufficient documentation

## 2013-02-17 DIAGNOSIS — L0390 Cellulitis, unspecified: Secondary | ICD-10-CM

## 2013-02-17 DIAGNOSIS — J45909 Unspecified asthma, uncomplicated: Secondary | ICD-10-CM | POA: Insufficient documentation

## 2013-02-17 DIAGNOSIS — Z87891 Personal history of nicotine dependence: Secondary | ICD-10-CM | POA: Insufficient documentation

## 2013-02-17 DIAGNOSIS — Z8679 Personal history of other diseases of the circulatory system: Secondary | ICD-10-CM | POA: Insufficient documentation

## 2013-02-17 DIAGNOSIS — F329 Major depressive disorder, single episode, unspecified: Secondary | ICD-10-CM | POA: Insufficient documentation

## 2013-02-17 DIAGNOSIS — F988 Other specified behavioral and emotional disorders with onset usually occurring in childhood and adolescence: Secondary | ICD-10-CM | POA: Insufficient documentation

## 2013-02-17 DIAGNOSIS — F3289 Other specified depressive episodes: Secondary | ICD-10-CM | POA: Insufficient documentation

## 2013-02-17 DIAGNOSIS — L02619 Cutaneous abscess of unspecified foot: Secondary | ICD-10-CM | POA: Insufficient documentation

## 2013-02-17 LAB — CBC WITH DIFFERENTIAL/PLATELET
Basophils Absolute: 0 10*3/uL (ref 0.0–0.1)
Basophils Relative: 0 % (ref 0–1)
Eosinophils Absolute: 0.6 10*3/uL (ref 0.0–0.7)
MCH: 30.8 pg (ref 26.0–34.0)
MCHC: 34.5 g/dL (ref 30.0–36.0)
Neutrophils Relative %: 76 % (ref 43–77)
Platelets: 357 10*3/uL (ref 150–400)
RBC: 3.54 MIL/uL — ABNORMAL LOW (ref 3.87–5.11)

## 2013-02-17 LAB — COMPREHENSIVE METABOLIC PANEL
ALT: 35 U/L (ref 0–35)
Albumin: 2.5 g/dL — ABNORMAL LOW (ref 3.5–5.2)
Alkaline Phosphatase: 110 U/L (ref 39–117)
Potassium: 2.9 mEq/L — ABNORMAL LOW (ref 3.5–5.1)
Sodium: 136 mEq/L (ref 135–145)
Total Protein: 6.4 g/dL (ref 6.0–8.3)

## 2013-02-17 MED ORDER — SULFAMETHOXAZOLE-TRIMETHOPRIM 800-160 MG PO TABS: 2.0000 | ORAL_TABLET | Freq: Two times a day (BID) | ORAL | Status: DC

## 2013-02-17 MED ORDER — CEPHALEXIN 500 MG PO CAPS: 500.0000 mg | ORAL_CAPSULE | Freq: Four times a day (QID) | ORAL | Status: DC

## 2013-02-17 MED ORDER — FLUCONAZOLE 150 MG PO TABS: 150.0000 mg | ORAL_TABLET | Freq: Once | ORAL | Status: DC

## 2013-02-17 MED ORDER — ONDANSETRON 4 MG PO TBDP: 4.0000 mg | ORAL_TABLET | Freq: Three times a day (TID) | ORAL | Status: AC | PRN

## 2013-02-17 MED ORDER — POTASSIUM CHLORIDE CRYS ER 20 MEQ PO TBCR
40.0000 meq | EXTENDED_RELEASE_TABLET | Freq: Once | ORAL | Status: AC
  Administered 2013-02-17: 40 meq via ORAL
  Filled 2013-02-17: qty 2

## 2013-02-17 MED ORDER — OXYCODONE-ACETAMINOPHEN 5-325 MG PO TABS
2.0000 | ORAL_TABLET | Freq: Once | ORAL | Status: AC
  Administered 2013-02-17: 2 via ORAL
  Filled 2013-02-17: qty 2

## 2013-02-17 MED ORDER — OXYCODONE-ACETAMINOPHEN 5-325 MG PO TABS: 1.0000 | ORAL_TABLET | Freq: Four times a day (QID) | ORAL | Status: DC | PRN

## 2013-02-17 NOTE — ED Notes (Addendum)
Pt seen on Thursday by her PCP regarding a small area on her foot.  PCP thought it was an allergic reaction and possibly cellulitis so he Rx antibiotics and pain meds.  Pt states she woke up this morning and her foot was drastically worse.  Pt has been soaking foot in Epson salts.

## 2013-02-17 NOTE — ED Notes (Signed)
Pt is here with severe abscess to right foot with redness around entire foot

## 2013-02-17 NOTE — ED Provider Notes (Signed)
CSN: [id number]     Arrival date & time 02/17/13  1352 History   First MD Initiated Contact with Patient 02/17/13 1416     Chief Complaint  Patient presents with  . Abscess   (Consider location/radiation/quality/duration/timing/severity/associated sxs/prior Treatment) HPI Comments: Patient presenting with pain, erythema, and swelling of her right foot.  She reports that her symptoms have been present for the past 4 days and are gradually worsening.  She was seen by her PCP three days ago and was given a prescription for Bactrim DS, which she reports that she has been taking as directed.  She reports that today she noticed a blistered area on the top of her foot.  She states that she has been soaking the foot in Epson salts.  She has been taking Percocet for the pain, which she reports is helping.  She denies nausea or vomiting, but reports that she does get mildly nauseous after taking the pain medication.  She denies fever or chills.  She reports that she does not have a history of DM or HIV.  Not immunocompromised.    The history is provided by the patient.    Past Medical History  Diagnosis Date  . Asthma   . Depression   . ADD (attention deficit disorder)   . H/O miscarriage, not currently pregnant   . Hemorrhoids, internal    Past Surgical History  Procedure Laterality Date  . Foot surgery     No family history on file. History  Substance Use Topics  . Smoking status: Former Smoker -- 0.01 packs/day    Types: Cigarettes  . Smokeless tobacco: Not on file  . Alcohol Use: No   OB History   Grav Para Term Preterm Abortions TAB SAB Ect Mult Living                 Review of Systems  All other systems reviewed and are negative.    Allergies  Review of patient's allergies indicates no known allergies.  Home Medications   Current Outpatient Rx  Name  Route  Sig  Dispense  Refill  . benzonatate (TESSALON) 200 MG capsule   Oral   Take 1 capsule (200 mg total) by mouth 3  (three) times daily.   20 capsule   0   . cetirizine (ZYRTEC) 10 MG tablet   Oral   Take 10 mg by mouth daily.         . diazepam (VALIUM) 10 MG tablet   Oral   Take 10 mg by mouth every 6 (six) hours as needed for anxiety.         . diphenhydrAMINE (BENADRYL) 25 MG tablet   Oral   Take 25 mg by mouth 2 (two) times daily as needed (congestion).         . fluconazole (DIFLUCAN) 150 MG tablet   Oral   Take 150 mg by mouth daily.         . fluticasone (FLOVENT HFA) 44 MCG/ACT inhaler   Inhalation   Inhale 2 puffs into the lungs 2 (two) times daily.   1 Inhaler   0   . guaiFENesin (MUCINEX) 600 MG 12 hr tablet   Oral   Take 1,200 mg by mouth 2 (two) times daily as needed for congestion.         Marland Kitchen ibuprofen (ADVIL,MOTRIN) 200 MG tablet   Oral   Take 800 mg by mouth 3 (three) times daily as needed for pain.         Marland Kitchen  levalbuterol (XOPENEX HFA) 45 MCG/ACT inhaler   Inhalation   Inhale 1-2 puffs into the lungs every 6 (six) hours as needed for wheezing or shortness of breath.   1 Inhaler   0   . levalbuterol (XOPENEX) 1.25 MG/0.5ML nebulizer solution   Nebulization   Take 0.63 mg by nebulization every 3 (three) hours as needed for wheezing or shortness of breath.   1 each   0   . methylphenidate (RITALIN) 20 MG tablet   Oral   Take 20 mg by mouth daily.         Marland Kitchen oxyCODONE-acetaminophen (PERCOCET/ROXICET) 5-325 MG per tablet   Oral   Take 1 tablet by mouth every 6 (six) hours as needed.   30 tablet   0   . pantoprazole (PROTONIX) 40 MG tablet   Oral   Take 1 tablet (40 mg total) by mouth 2 (two) times daily before a meal.   60 tablet   0   . PROAIR HFA 108 (90 BASE) MCG/ACT inhaler   Inhalation   Inhale 2 puffs into the lungs every 6 (six) hours as needed. For shortness of breath         . sulfamethoxazole-trimethoprim (BACTRIM DS) 800-160 MG per tablet   Oral   Take 1 tablet by mouth daily.         . Vilazodone HCl (VIIBRYD) 20 MG  TABS   Oral   Take 20 mg by mouth daily.          BP 118/66  Pulse 97  Temp(Src) 99.2 F (37.3 C) (Oral)  Resp 18  SpO2 97% Physical Exam  Nursing note and vitals reviewed. Constitutional: She appears well-developed and well-nourished.  HENT:  Head: Normocephalic and atraumatic.  Neck: Normal range of motion. Neck supple.  Cardiovascular: Normal rate, regular rhythm and normal heart sounds.   Pulmonary/Chest: Effort normal and breath sounds normal.  Neurological: She is alert. No sensory deficit.  Distal sensation of all toes of the right foot intact  Skin: Skin is warm and dry.  Patient with extensive erythema and warmth of the right foot with bullae present to the dorsal aspect of the foot.  Clear drainage from the bulla.  No fluctuance.  Pitting edema present in the right foot extending to the mid shin.  Capillary refill of all toes of the right foot less than 2 seconds.  Psychiatric: She has a normal mood and affect.    ED Course  Procedures (including critical care time) Labs Review Labs Reviewed  CBC WITH DIFFERENTIAL  COMPREHENSIVE METABOLIC PANEL   Imaging Review No results found.  Patient also evaluated by Dr. Fonnie Jarvis.  MDM  No diagnosis found. Patient with cellulitis of her right foot.  No abscess present.  She is neurovascularly intact.  Afebrile.  WBC is WNL.  Patient is not immunocompromised.  Patient given prescription for Keflex to take in addition to the Bactrim DS.  Patient also given prescription for pain medication.  Patient instructed to follow up in 2 days to have the area rechecked.    Pascal Lux [place], PA-C 02/17/13 1614

## 2013-02-17 NOTE — ED Provider Notes (Signed)
Medical screening examination/treatment/procedure(s) were conducted as a shared visit with non-physician practitioner(s) and myself.  I personally evaluated the patient during the encounter.  Localized right foot dorsum cellulitis with bulla with clear drainage without fluctuance or abscess compartment syndrome or necrotizing fasciitis, cap refill less than 2 seconds normal light touch good movement right foot    Hurman Horn, MD 02/18/13 1529

## 2013-02-18 ENCOUNTER — Encounter (HOSPITAL_COMMUNITY): Payer: Self-pay | Admitting: Emergency Medicine

## 2013-02-18 DIAGNOSIS — Z79899 Other long term (current) drug therapy: Secondary | ICD-10-CM

## 2013-02-18 DIAGNOSIS — K648 Other hemorrhoids: Secondary | ICD-10-CM | POA: Diagnosis present

## 2013-02-18 DIAGNOSIS — F3289 Other specified depressive episodes: Secondary | ICD-10-CM | POA: Diagnosis present

## 2013-02-18 DIAGNOSIS — F329 Major depressive disorder, single episode, unspecified: Secondary | ICD-10-CM | POA: Diagnosis present

## 2013-02-18 DIAGNOSIS — M869 Osteomyelitis, unspecified: Principal | ICD-10-CM | POA: Diagnosis present

## 2013-02-18 DIAGNOSIS — Z87891 Personal history of nicotine dependence: Secondary | ICD-10-CM

## 2013-02-18 DIAGNOSIS — J45909 Unspecified asthma, uncomplicated: Secondary | ICD-10-CM | POA: Diagnosis present

## 2013-02-18 DIAGNOSIS — Z6829 Body mass index (BMI) 29.0-29.9, adult: Secondary | ICD-10-CM

## 2013-02-18 DIAGNOSIS — E669 Obesity, unspecified: Secondary | ICD-10-CM | POA: Diagnosis present

## 2013-02-18 DIAGNOSIS — L02419 Cutaneous abscess of limb, unspecified: Secondary | ICD-10-CM | POA: Diagnosis present

## 2013-02-18 DIAGNOSIS — F988 Other specified behavioral and emotional disorders with onset usually occurring in childhood and adolescence: Secondary | ICD-10-CM | POA: Diagnosis present

## 2013-02-18 DIAGNOSIS — Z23 Encounter for immunization: Secondary | ICD-10-CM

## 2013-02-18 DIAGNOSIS — L02619 Cutaneous abscess of unspecified foot: Secondary | ICD-10-CM | POA: Diagnosis present

## 2013-02-18 DIAGNOSIS — A4902 Methicillin resistant Staphylococcus aureus infection, unspecified site: Secondary | ICD-10-CM | POA: Diagnosis present

## 2013-02-18 DIAGNOSIS — K5900 Constipation, unspecified: Secondary | ICD-10-CM | POA: Diagnosis present

## 2013-02-18 DIAGNOSIS — F411 Generalized anxiety disorder: Secondary | ICD-10-CM | POA: Diagnosis present

## 2013-02-18 DIAGNOSIS — D62 Acute posthemorrhagic anemia: Secondary | ICD-10-CM | POA: Diagnosis not present

## 2013-02-18 NOTE — ED Notes (Signed)
Pt. reports persistent right foot pain with swelling seen here yesterday prescribed with oral antibiotics and Percocet.

## 2013-02-19 ENCOUNTER — Emergency Department (HOSPITAL_COMMUNITY): Payer: BC Managed Care – PPO

## 2013-02-19 ENCOUNTER — Inpatient Hospital Stay (HOSPITAL_COMMUNITY)
Admission: EM | Admit: 2013-02-19 | Discharge: 2013-02-25 | DRG: 217 | Disposition: A | Discharge status: Home / Self Care | Payer: BC Managed Care – PPO | Attending: Internal Medicine | Admitting: Internal Medicine

## 2013-02-19 DIAGNOSIS — D649 Anemia, unspecified: Secondary | ICD-10-CM

## 2013-02-19 DIAGNOSIS — F411 Generalized anxiety disorder: Secondary | ICD-10-CM | POA: Diagnosis present

## 2013-02-19 DIAGNOSIS — J45909 Unspecified asthma, uncomplicated: Secondary | ICD-10-CM

## 2013-02-19 DIAGNOSIS — L02419 Cutaneous abscess of limb, unspecified: Secondary | ICD-10-CM

## 2013-02-19 DIAGNOSIS — M869 Osteomyelitis, unspecified: Secondary | ICD-10-CM

## 2013-02-19 LAB — CBC
HCT: 30.1 % — ABNORMAL LOW (ref 36.0–46.0)
Hemoglobin: 10.2 g/dL — ABNORMAL LOW (ref 12.0–15.0)
MCH: 30.6 pg (ref 26.0–34.0)
MCH: 29.9 pg (ref 26.0–34.0)
MCHC: 33.9 g/dL (ref 30.0–36.0)
Platelets: 459 10*3/uL — ABNORMAL HIGH (ref 150–400)
RBC: 3.18 MIL/uL — ABNORMAL LOW (ref 3.87–5.11)
WBC: 7.6 10*3/uL (ref 4.0–10.5)

## 2013-02-19 LAB — COMPREHENSIVE METABOLIC PANEL
Alkaline Phosphatase: 100 U/L (ref 39–117)
BUN: 11 mg/dL (ref 6–23)
BUN: 9 mg/dL (ref 6–23)
CO2: 26 mEq/L (ref 19–32)
CO2: 25 mEq/L (ref 19–32)
Chloride: 100 mEq/L (ref 96–112)
Creatinine, Ser: 0.76 mg/dL (ref 0.50–1.10)
GFR calc Af Amer: >90 mL/min (ref >90)
GFR calc non Af Amer: >90 mL/min (ref >90)
GFR calc non Af Amer: >90 mL/min (ref >90)
Glucose, Bld: 97 mg/dL (ref 70–99)
Potassium: 3.5 mEq/L (ref 3.5–5.1)
Total Bilirubin: 0.1 mg/dL — ABNORMAL LOW (ref 0.3–1.2)
Total Protein: 6.3 g/dL (ref 6.0–8.3)

## 2013-02-19 LAB — PROTIME-INR: Prothrombin Time: 14.4 seconds (ref 11.6–15.2)

## 2013-02-19 LAB — POCT I-STAT, CHEM 8
BUN: 9 mg/dL (ref 6–23)
HCT: 32 % — ABNORMAL LOW (ref 36.0–46.0)
Hemoglobin: 10.9 g/dL — ABNORMAL LOW (ref 12.0–15.0)
Sodium: 136 mEq/L (ref 135–145)
TCO2: 24 mmol/L (ref 0–100)

## 2013-02-19 LAB — CG4 I-STAT (LACTIC ACID): Lactic Acid, Venous: 1.14 mmol/L (ref 0.5–2.2)

## 2013-02-19 MED ORDER — DIAZEPAM 5 MG PO TABS
10.0000 mg | ORAL_TABLET | Freq: Four times a day (QID) | ORAL | Status: DC | PRN
  Administered 2013-02-19 – 2013-02-25 (×11): 10 mg via ORAL
  Filled 2013-02-19 (×11): qty 2

## 2013-02-19 MED ORDER — HYDROMORPHONE HCL PF 1 MG/ML IJ SOLN
INTRAMUSCULAR | Status: AC
  Filled 2013-02-19: qty 1

## 2013-02-19 MED ORDER — SODIUM CHLORIDE 0.9 % IV SOLN
INTRAVENOUS | Status: DC
  Administered 2013-02-19: 02:00:00 via INTRAVENOUS
  Administered 2013-02-19: 125 mL/h via INTRAVENOUS
  Administered 2013-02-20: 05:00:00 via INTRAVENOUS

## 2013-02-19 MED ORDER — ENOXAPARIN SODIUM 40 MG/0.4ML ~~LOC~~ SOLN
40.0000 mg | Freq: Every day | SUBCUTANEOUS | Status: DC
  Administered 2013-02-19 – 2013-02-20 (×2): 40 mg via SUBCUTANEOUS
  Filled 2013-02-19 (×2): qty 0.4

## 2013-02-19 MED ORDER — DIPHENHYDRAMINE HCL 25 MG PO CAPS
25.0000 mg | ORAL_CAPSULE | Freq: Two times a day (BID) | ORAL | Status: DC | PRN
  Administered 2013-02-21: 25 mg via ORAL
  Filled 2013-02-19: qty 1

## 2013-02-19 MED ORDER — HYDROMORPHONE HCL PF 1 MG/ML IJ SOLN
1.0000 mg | Freq: Once | INTRAMUSCULAR | Status: AC
  Administered 2013-02-19: 1 mg via INTRAVENOUS
  Filled 2013-02-19: qty 1

## 2013-02-19 MED ORDER — ALBUTEROL SULFATE HFA 108 (90 BASE) MCG/ACT IN AERS: 2.0000 | INHALATION_SPRAY | Freq: Four times a day (QID) | RESPIRATORY_TRACT | Status: DC | PRN

## 2013-02-19 MED ORDER — PIPERACILLIN-TAZOBACTAM 3.375 G IVPB
3.3750 g | Freq: Three times a day (TID) | INTRAVENOUS | Status: DC
  Administered 2013-02-19 – 2013-02-21 (×7): 3.375 g via INTRAVENOUS
  Filled 2013-02-19 (×9): qty 50

## 2013-02-19 MED ORDER — ONDANSETRON HCL 4 MG/2ML IJ SOLN: 4.0000 mg | Freq: Three times a day (TID) | INTRAMUSCULAR | Status: DC | PRN

## 2013-02-19 MED ORDER — VANCOMYCIN HCL 10 G IV SOLR
1250.0000 mg | Freq: Two times a day (BID) | INTRAVENOUS | Status: DC
  Administered 2013-02-19 – 2013-02-21 (×6): 1250 mg via INTRAVENOUS
  Filled 2013-02-19 (×8): qty 1250

## 2013-02-19 MED ORDER — HYDROMORPHONE HCL PF 1 MG/ML IJ SOLN
0.5000 mg | Freq: Once | INTRAMUSCULAR | Status: AC
  Administered 2013-02-19: 0.5 mg via INTRAVENOUS
  Filled 2013-02-19: qty 1

## 2013-02-19 MED ORDER — DIAZEPAM 5 MG PO TABS
5.0000 mg | ORAL_TABLET | Freq: Once | ORAL | Status: AC
  Administered 2013-02-19: 5 mg via ORAL
  Filled 2013-02-19: qty 1

## 2013-02-19 MED ORDER — HYDROMORPHONE HCL PF 1 MG/ML IJ SOLN
0.5000 mg | INTRAMUSCULAR | Status: DC | PRN
  Administered 2013-02-19 – 2013-02-20 (×9): 0.5 mg via INTRAVENOUS
  Filled 2013-02-19 (×8): qty 1

## 2013-02-19 MED ORDER — HYDROMORPHONE HCL PF 1 MG/ML IJ SOLN: 0.5000 mg | INTRAMUSCULAR | Status: DC | PRN

## 2013-02-19 MED ORDER — OXYCODONE HCL 5 MG PO TABS
5.0000 mg | ORAL_TABLET | ORAL | Status: DC | PRN
  Administered 2013-02-19 – 2013-02-22 (×7): 5 mg via ORAL
  Filled 2013-02-19 (×7): qty 1

## 2013-02-19 MED ORDER — PANTOPRAZOLE SODIUM 40 MG PO TBEC
40.0000 mg | DELAYED_RELEASE_TABLET | Freq: Two times a day (BID) | ORAL | Status: DC
  Administered 2013-02-19 – 2013-02-25 (×11): 40 mg via ORAL
  Filled 2013-02-19 (×11): qty 1

## 2013-02-19 MED ORDER — METHYLPHENIDATE HCL 5 MG PO TABS
10.0000 mg | ORAL_TABLET | Freq: Two times a day (BID) | ORAL | Status: DC
  Administered 2013-02-22 – 2013-02-25 (×6): 10 mg via ORAL
  Filled 2013-02-19 (×6): qty 2

## 2013-02-19 MED ORDER — ACETAMINOPHEN 325 MG PO TABS
650.0000 mg | ORAL_TABLET | Freq: Four times a day (QID) | ORAL | Status: DC | PRN
  Administered 2013-02-19 – 2013-02-25 (×7): 650 mg via ORAL
  Filled 2013-02-19 (×6): qty 2
  Filled 2013-02-19: qty 1
  Filled 2013-02-19: qty 2

## 2013-02-19 MED ORDER — ONDANSETRON HCL 4 MG/2ML IJ SOLN
4.0000 mg | Freq: Once | INTRAMUSCULAR | Status: AC
  Administered 2013-02-19: 4 mg via INTRAVENOUS
  Filled 2013-02-19: qty 2

## 2013-02-19 MED ORDER — POLYETHYLENE GLYCOL 3350 17 G PO PACK: 17.0000 g | PACK | Freq: Every day | ORAL | Status: DC | PRN

## 2013-02-19 MED ORDER — ONDANSETRON HCL 4 MG/2ML IJ SOLN
4.0000 mg | Freq: Four times a day (QID) | INTRAMUSCULAR | Status: DC | PRN
  Administered 2013-02-19 – 2013-02-22 (×4): 4 mg via INTRAVENOUS
  Filled 2013-02-19 (×4): qty 2

## 2013-02-19 MED ORDER — BISACODYL 10 MG RE SUPP: 10.0000 mg | Freq: Every day | RECTAL | Status: DC | PRN

## 2013-02-19 MED ORDER — GUAIFENESIN ER 600 MG PO TB12: 1200.0000 mg | ORAL_TABLET | Freq: Two times a day (BID) | ORAL | Status: DC | PRN

## 2013-02-19 MED ORDER — CLINDAMYCIN PHOSPHATE 900 MG/50ML IV SOLN
900.0000 mg | Freq: Once | INTRAVENOUS | Status: AC
  Administered 2013-02-19: 900 mg via INTRAVENOUS
  Filled 2013-02-19: qty 50

## 2013-02-19 MED ORDER — HYDROMORPHONE HCL PF 1 MG/ML IJ SOLN: 1.0000 mg | INTRAMUSCULAR | Status: DC | PRN

## 2013-02-19 MED ORDER — ONDANSETRON HCL 4 MG PO TABS: 4.0000 mg | ORAL_TABLET | Freq: Four times a day (QID) | ORAL | Status: DC | PRN

## 2013-02-19 MED ORDER — POLYETHYLENE GLYCOL 3350 17 G PO PACK
17.0000 g | PACK | Freq: Two times a day (BID) | ORAL | Status: DC
  Administered 2013-02-19 – 2013-02-22 (×4): 17 g via ORAL
  Filled 2013-02-19 (×15): qty 1

## 2013-02-19 MED ORDER — FLUTICASONE PROPIONATE HFA 44 MCG/ACT IN AERO
2.0000 | INHALATION_SPRAY | Freq: Two times a day (BID) | RESPIRATORY_TRACT | Status: DC
  Administered 2013-02-19 – 2013-02-25 (×12): 2 via RESPIRATORY_TRACT
  Filled 2013-02-19 (×2): qty 10.6

## 2013-02-19 MED ORDER — VILAZODONE HCL 20 MG PO TABS
20.0000 mg | ORAL_TABLET | Freq: Every day | ORAL | Status: DC
  Administered 2013-02-19 – 2013-02-25 (×6): 20 mg via ORAL
  Filled 2013-02-19 (×7): qty 1

## 2013-02-19 NOTE — ED Provider Notes (Signed)
CSN: [id number]     Arrival date & time 02/18/13  2336 History   First MD Initiated Contact with Patient 02/19/13 0126     Chief Complaint  Patient presents with  . Foot Pain   (Consider location/radiation/quality/duration/timing/severity/associated sxs/prior Treatment) HPI Hx per PT - R foot pain, redness and swelling onset 6 days ago, placed on Bactrim by PCP at that time.  She had no F/C or N/V at that time.  She returned here yesterday for worsening symptoms of pain and redness and was discharged with increased PO ABx. She returns today with fever, severe pain and now areas of new increased swelling with purulent drainage.    Has recently been on steroids for asthma exacerbation, is not diabetic. Has nausea no emesis.     h/o R foot surgery 2-3 years ago with hardware in place. Physician at [facility].   Past Medical History  Diagnosis Date  . Asthma   . Depression   . ADD (attention deficit disorder)   . H/O miscarriage, not currently pregnant   . Hemorrhoids, internal    Past Surgical History  Procedure Laterality Date  . Foot surgery     No family history on file. History  Substance Use Topics  . Smoking status: Former Smoker -- 0.01 packs/day    Types: Cigarettes  . Smokeless tobacco: Not on file  . Alcohol Use: No   OB History   Grav Para Term Preterm Abortions TAB SAB Ect Mult Living                 Review of Systems  Constitutional: Positive for fever and chills.  HENT: Negative for neck pain.   Eyes: Negative for visual disturbance.  Respiratory: Negative for shortness of breath.   Cardiovascular: Negative for chest pain.  Gastrointestinal: Positive for nausea. Negative for vomiting and abdominal pain.  Musculoskeletal: Negative for back pain.  Skin: Positive for rash and wound.  Neurological: Negative for headaches.  All other systems reviewed and are negative.    Allergies  Review of patient's allergies indicates no known allergies.  Home  Medications   Current Outpatient Rx  Name  Route  Sig  Dispense  Refill  . benzonatate (TESSALON) 200 MG capsule   Oral   Take 1 capsule (200 mg total) by mouth 3 (three) times daily.   20 capsule   0   . cephALEXin (KEFLEX) 500 MG capsule   Oral   Take 1 capsule (500 mg total) by mouth 4 (four) times daily.   40 capsule   0   . cetirizine (ZYRTEC) 10 MG tablet   Oral   Take 10 mg by mouth daily.         . diazepam (VALIUM) 10 MG tablet   Oral   Take 10 mg by mouth every 6 (six) hours as needed for anxiety.         . diphenhydrAMINE (BENADRYL) 25 MG tablet   Oral   Take 25 mg by mouth 2 (two) times daily as needed (congestion).         . fluconazole (DIFLUCAN) 150 MG tablet   Oral   Take 1 tablet (150 mg total) by mouth once.   1 tablet   1   . fluticasone (FLOVENT HFA) 44 MCG/ACT inhaler   Inhalation   Inhale 2 puffs into the lungs 2 (two) times daily.   1 Inhaler   0   . guaiFENesin (MUCINEX) 600 MG 12 hr tablet  Oral   Take 1,200 mg by mouth 2 (two) times daily as needed for congestion.         Marland Kitchen ibuprofen (ADVIL,MOTRIN) 200 MG tablet   Oral   Take 800 mg by mouth 3 (three) times daily as needed for pain.         Marland Kitchen levalbuterol (XOPENEX HFA) 45 MCG/ACT inhaler   Inhalation   Inhale 1-2 puffs into the lungs every 6 (six) hours as needed for wheezing or shortness of breath.   1 Inhaler   0   . levalbuterol (XOPENEX) 1.25 MG/0.5ML nebulizer solution   Nebulization   Take 0.63 mg by nebulization every 3 (three) hours as needed for wheezing or shortness of breath.   1 each   0   . methylphenidate (RITALIN) 20 MG tablet   Oral   Take 20 mg by mouth daily.         . ondansetron (ZOFRAN ODT) 4 MG disintegrating tablet   Oral   Take 1 tablet (4 mg total) by mouth every 8 (eight) hours as needed for nausea.   20 tablet   0   . oxyCODONE-acetaminophen (PERCOCET/ROXICET) 5-325 MG per tablet   Oral   Take 1 tablet by mouth every 6 (six) hours  as needed.   30 tablet   0   . pantoprazole (PROTONIX) 40 MG tablet   Oral   Take 1 tablet (40 mg total) by mouth 2 (two) times daily before a meal.   60 tablet   0   . PROAIR HFA 108 (90 BASE) MCG/ACT inhaler   Inhalation   Inhale 2 puffs into the lungs every 6 (six) hours as needed. For shortness of breath         . sulfamethoxazole-trimethoprim (SEPTRA DS) 800-160 MG per tablet   Oral   Take 2 tablets by mouth 2 (two) times daily.   20 tablet   0   . Vilazodone HCl (VIIBRYD) 20 MG TABS   Oral   Take 20 mg by mouth daily.          BP 113/59  Pulse 97  Temp(Src) 100.2 F (37.9 C) (Oral)  Resp 18  Ht 5\' 2"  (1.575 m)  Wt 160 lb (72.576 kg)  BMI 29.26 kg/m2  SpO2 96% Physical Exam  Constitutional: She is oriented to person, place, and time. She appears well-developed and well-nourished.  HENT:  Head: Normocephalic and atraumatic.  Eyes: EOM are normal. Pupils are equal, round, and reactive to light.  Neck: Neck supple.  Cardiovascular: Regular rhythm and intact distal pulses.   tachycardic  Pulmonary/Chest: Effort normal and breath sounds normal. No respiratory distress.  Musculoskeletal:  RLE with tenderness, swelling, inc warmth to touch and erythema that extends beyond her ankle and to her toes - ink outlined. There is some streaking erythema and associated large areas of fluctuance and drainage.    Neurological: She is alert and oriented to person, place, and time.  Skin: Skin is warm and dry.    ED Course  Procedures (including critical care time) Labs Review Results for orders placed during the hospital encounter of 02/19/13  CBC      Result Value Range   WBC 7.5  4.0 - 10.5 K/uL   RBC 3.33 (*) 3.87 - 5.11 MIL/uL   Hemoglobin 10.2 (*) 12.0 - 15.0 g/dL   HCT 86.5 (*) 78.4 - 69.6 %   MCV 90.4  78.0 - 100.0 fL   MCH 30.6  26.0 -  34.0 pg   MCHC 33.9  30.0 - 36.0 g/dL   RDW 82.9  56.2 - 13.0 %   Platelets 438 (*) 150 - 400 K/uL  COMPREHENSIVE  METABOLIC PANEL      Result Value Range   Sodium 136  135 - 145 mEq/L   Potassium 3.5  3.5 - 5.1 mEq/L   Chloride 99  96 - 112 mEq/L   CO2 26  19 - 32 mEq/L   Glucose, Bld 97  70 - 99 mg/dL   BUN 11  6 - 23 mg/dL   Creatinine, Ser 8.65  0.50 - 1.10 mg/dL   Calcium 8.5  8.4 - 78.4 mg/dL   Total Protein 6.3  6.0 - 8.3 g/dL   Albumin 2.5 (*) 3.5 - 5.2 g/dL   AST 26  0 - 37 U/L   ALT 34  0 - 35 U/L   Alkaline Phosphatase 100  39 - 117 U/L   Total Bilirubin <0.1 (*) 0.3 - 1.2 mg/dL   GFR calc non Af Amer >90  >90 mL/min   GFR calc Af Amer >90  >90 mL/min  POCT I-STAT, CHEM 8      Result Value Range   Sodium 136  135 - 145 mEq/L   Potassium 3.5  3.5 - 5.1 mEq/L   Chloride 103  96 - 112 mEq/L   BUN 9  6 - 23 mg/dL   Creatinine, Ser 6.96  0.50 - 1.10 mg/dL   Glucose, Bld 295 (*) 70 - 99 mg/dL   Calcium, Ion 2.84 (*) 1.12 - 1.23 mmol/L   TCO2 24  0 - 100 mmol/L   Hemoglobin 10.9 (*) 12.0 - 15.0 g/dL   HCT 13.2 (*) 44.0 - 10.2 %  CG4 I-STAT (LACTIC ACID)      Result Value Range   Lactic Acid, Venous 1.14  0.5 - 2.2 mmol/L     Dg Foot Complete Right  02/19/2013   *RADIOLOGY REPORT*  Clinical Data: Foot pain.  Rule out osteomyelitis.  RIGHT FOOT COMPLETE - 3+ VIEW  Comparison: None.  Findings: There is marked soft tissue swelling along the dorsum of the foot.  There is a gas and fluid dorsal to the midfoot tarsal bones.  Possible fluid collection measures 38 x 4.3 cm.  No underlying bony erosion identified.  Postoperative changes involving the proximal phalanx and distal aspect of the fifth metatarsal.  IMPRESSION:  1.  There is a out possible gas and fluid collection along the dorsum of the midfoot.  Advise further evaluation with cross- sectional imaging.  The study of choice would be a contrast- enhanced MRI or contrast enhanced CT. 2.  No focal bony erosions identified.   Original Report Authenticated By: Signa Kell, M.D.   IV ABX - Clindamycin IVFs, IV Dilaudid  Dr Allena Katz to  admit 3:14 AM d/w Ortho, DR Charlann Boxer will follow  MDM  Dx: R foot with cellulitis/ abscess Labs, xray as above IV medications MED admit   Sunnie Nielsen, MD 02/19/13 [phone]

## 2013-02-19 NOTE — Progress Notes (Signed)
ANTIBIOTIC CONSULT NOTE - INITIAL  Pharmacy Consult for vancomycin and zosyn Indication: osteomyelitis  No Known Allergies  Patient Measurements: Height: 5\' 2"  (157.5 cm) Weight: 201 lb 6.4 oz (91.354 kg) IBW/kg (Calculated) : 50.1   Vital Signs: Temp: 99 F (37.2 C) (09/09 0417) Temp src: Oral (09/09 0417) BP: 115/71 mmHg (09/09 0417) Pulse Rate: 90 (09/09 0417) Intake/Output from previous day:   Intake/Output from this shift:    Labs:  Recent Labs  02/17/13 1419 02/19/13 0146 02/19/13 0151  WBC 9.8 7.5  --   HGB 10.9* 10.2* 10.9*  PLT 357 438*  --   CREATININE 0.57 0.77 1.00   Estimated Creatinine Clearance: 77.8 ml/min (by C-G formula based on Cr of 1). No results found for this basename: VANCOTROUGH, Leodis Binet, VANCORANDOM, GENTTROUGH, GENTPEAK, GENTRANDOM, TOBRATROUGH, TOBRAPEAK, TOBRARND, AMIKACINPEAK, AMIKACINTROU, AMIKACIN,  in the last 72 hours   Microbiology: Recent Results (from the past 720 hour(s))  MRSA PCR SCREENING     Status: None   Collection Time    01/25/13  6:33 PM      Result Value Range Status   MRSA by PCR NEGATIVE  NEGATIVE Final   Comment:            The GeneXpert MRSA Assay (FDA     approved for NASAL specimens     only), is one component of a     comprehensive MRSA colonization     surveillance program. It is not     intended to diagnose MRSA     infection nor to guide or     monitor treatment for     MRSA infections.    Medical History: Past Medical History  Diagnosis Date  . Asthma   . Depression   . ADD (attention deficit disorder)   . H/O miscarriage, not currently pregnant   . Hemorrhoids, internal     Medications:  Prescriptions prior to admission  Medication Sig Dispense Refill  . benzonatate (TESSALON) 200 MG capsule Take 1 capsule (200 mg total) by mouth 3 (three) times daily.  20 capsule  0  . cephALEXin (KEFLEX) 500 MG capsule Take 1 capsule (500 mg total) by mouth 4 (four) times daily.  40 capsule  0  .  cetirizine (ZYRTEC) 10 MG tablet Take 10 mg by mouth daily.      . diazepam (VALIUM) 10 MG tablet Take 10 mg by mouth every 6 (six) hours as needed for anxiety.      . diphenhydrAMINE (BENADRYL) 25 MG tablet Take 25 mg by mouth 2 (two) times daily as needed (congestion).      . fluconazole (DIFLUCAN) 150 MG tablet Take 1 tablet (150 mg total) by mouth once.  1 tablet  1  . fluticasone (FLOVENT HFA) 44 MCG/ACT inhaler Inhale 2 puffs into the lungs 2 (two) times daily.  1 Inhaler  0  . guaiFENesin (MUCINEX) 600 MG 12 hr tablet Take 1,200 mg by mouth 2 (two) times daily as needed for congestion.      Marland Kitchen ibuprofen (ADVIL,MOTRIN) 200 MG tablet Take 800 mg by mouth 3 (three) times daily as needed for pain.      Marland Kitchen levalbuterol (XOPENEX HFA) 45 MCG/ACT inhaler Inhale 1-2 puffs into the lungs every 6 (six) hours as needed for wheezing or shortness of breath.  1 Inhaler  0  . levalbuterol (XOPENEX) 1.25 MG/0.5ML nebulizer solution Take 0.63 mg by nebulization every 3 (three) hours as needed for wheezing or shortness of breath.  1  each  0  . methylphenidate (RITALIN) 20 MG tablet Take 20 mg by mouth daily.      . ondansetron (ZOFRAN ODT) 4 MG disintegrating tablet Take 1 tablet (4 mg total) by mouth every 8 (eight) hours as needed for nausea.  20 tablet  0  . oxyCODONE-acetaminophen (PERCOCET/ROXICET) 5-325 MG per tablet Take 1 tablet by mouth every 6 (six) hours as needed.  30 tablet  0  . pantoprazole (PROTONIX) 40 MG tablet Take 1 tablet (40 mg total) by mouth 2 (two) times daily before a meal.  60 tablet  0  . PROAIR HFA 108 (90 BASE) MCG/ACT inhaler Inhale 2 puffs into the lungs every 6 (six) hours as needed. For shortness of breath      . sulfamethoxazole-trimethoprim (SEPTRA DS) 800-160 MG per tablet Take 2 tablets by mouth 2 (two) times daily.  20 tablet  0  . Vilazodone HCl (VIIBRYD) 20 MG TABS Take 20 mg by mouth daily.       Assessment: 42 yo F to start vancomycin and zosyn per pharmacy for  osteomyelitis.  R foot with redness and swelling onset 6 days ago.  She failed outpt septra and keflex.  WBC 7.5, creat 1. Tem 100.2, wt 91.4 kg.  DG of foot: possible gas and fluid collection along the dorsum of the midfoot.    clinda 900 x1 in ED 9/9  vanc 9/9>> Zosyn 9/9>>  9/9 wound cx   Goal of Therapy:  Vancomycin trough level 15-20 mcg/ml  Plan:  1. Vancomycin 1250 mg IV q12h 2. Zosyn 3.375 gm IV q8h, infuse each dose over 4 hours 3. F/u renal fxn, culture data, fever curve, wbc, clinical course 4. Check steady-state vancomycin trough as needed Herby Abraham, Pharm.D. [phone] 02/19/2013 4:51 AM

## 2013-02-19 NOTE — ED Notes (Signed)
Patient transported to X-ray 

## 2013-02-19 NOTE — H&P (Signed)
[facility] History and Physical  Patient: Tracey Ward  JXB:[id number]  DOB: [birth year]  DOA: 02/19/2013  Referring physician: Dr. Dierdre Highman PCP: [name], MD  Specialists: Dr.Olin orthopedic   Chief Complaint: Foot swelling  HPI: Tracey Ward is a 42 y.o. female with Past medical history of asthma, depression, ADD. She presented today with the complaint of worsening of her swelling that has been ongoing since last one week. She noted this while she was at her sister's house last Tuesday at around midnight. On the right foot. It started with an itching and warmth. Later on she also had some pain and she saw the PCP, who initially felt it could be an allergic reaction, and later on prescribed her Bactrim. After that she had at trip to [place]. She denies soaking her food in the sea water. She denies any seafood ingestion.he denies any bug bites. After that since she was having significantly difficulty walking she presented to the [facility] where she was given oral Keflex and oral Percocet.despite that she continued to have progressively worsening swelling and redness of the leg with some fever. She denies any numbness she has mild cough tenderness . although she is ambulating in the house. She denies any chest pain, shortness of breath, chills, nausea, vomiting, abdominal pain, diarrhea. She is constipated at present.   Review of Systems: as mentioned in the history of present illness.  A Comprehensive review of the other systems is negative.  Past Medical History  Diagnosis Date  . Asthma   . Depression   . ADD (attention deficit disorder)   . H/O miscarriage, not currently pregnant   . Hemorrhoids, internal    Past Surgical History  Procedure Laterality Date  . Foot surgery     Social History:  reports that she has quit smoking. Her smoking use included Cigarettes. She smoked 0.01 packs per day. She does not have any smokeless tobacco history on file. She reports that  she does not drink alcohol or use illicit drugs. Patient is coming from home. Independent for most of her  ADL.  No Known Allergies  No family history on file.  Prior to Admission medications   Medication Sig Start Date End Date Taking? Authorizing Provider  benzonatate (TESSALON) 200 MG capsule Take 1 capsule (200 mg total) by mouth 3 (three) times daily. 02/02/13  Yes Lonia Blood, MD  cephALEXin (KEFLEX) 500 MG capsule Take 1 capsule (500 mg total) by mouth 4 (four) times daily. 02/17/13  Yes Heather Zenaida Niece Wingen, PA-C  cetirizine (ZYRTEC) 10 MG tablet Take 10 mg by mouth daily.   Yes Historical Provider, MD  diazepam (VALIUM) 10 MG tablet Take 10 mg by mouth every 6 (six) hours as needed for anxiety.   Yes Historical Provider, MD  diphenhydrAMINE (BENADRYL) 25 MG tablet Take 25 mg by mouth 2 (two) times daily as needed (congestion).   Yes Historical Provider, MD  fluconazole (DIFLUCAN) 150 MG tablet Take 1 tablet (150 mg total) by mouth once. 02/17/13  Yes Heather Zenaida Niece Wingen, PA-C  fluticasone (FLOVENT HFA) 44 MCG/ACT inhaler Inhale 2 puffs into the lungs 2 (two) times daily. 02/02/13  Yes Lonia Blood, MD  guaiFENesin (MUCINEX) 600 MG 12 hr tablet Take 1,200 mg by mouth 2 (two) times daily as needed for congestion.   Yes Historical Provider, MD  ibuprofen (ADVIL,MOTRIN) 200 MG tablet Take 800 mg by mouth 3 (three) times daily as needed for pain.   Yes Historical Provider,  MD  levalbuterol [facility] HFA) 45 MCG/ACT inhaler Inhale 1-2 puffs into the lungs every 6 (six) hours as needed for wheezing or shortness of breath. 02/02/13  Yes Lonia Blood, MD  levalbuterol Pauline Aus) 1.25 MG/0.5ML nebulizer solution Take 0.63 mg by nebulization every 3 (three) hours as needed for wheezing or shortness of breath. 02/02/13  Yes Lonia Blood, MD  methylphenidate (RITALIN) 20 MG tablet Take 20 mg by mouth daily. 01/08/13  Yes Historical Provider, MD  ondansetron (ZOFRAN ODT) 4 MG disintegrating  tablet Take 1 tablet (4 mg total) by mouth every 8 (eight) hours as needed for nausea. 02/17/13  Yes Heather Zenaida Niece Wingen, PA-C  oxyCODONE-acetaminophen (PERCOCET/ROXICET) 5-325 MG per tablet Take 1 tablet by mouth every 6 (six) hours as needed. 02/02/13  Yes Lonia Blood, MD  pantoprazole (PROTONIX) 40 MG tablet Take 1 tablet (40 mg total) by mouth 2 (two) times daily before a meal. 02/02/13  Yes Lonia Blood, MD  PROAIR HFA 108 (90 BASE) MCG/ACT inhaler Inhale 2 puffs into the lungs every 6 (six) hours as needed. For shortness of breath 02/14/13  Yes Historical Provider, MD  sulfamethoxazole-trimethoprim (SEPTRA DS) 800-160 MG per tablet Take 2 tablets by mouth 2 (two) times daily. 02/17/13  Yes Heather Zenaida Niece Wingen, PA-C  Vilazodone HCl (VIIBRYD) 20 MG TABS Take 20 mg by mouth daily.   Yes Historical Provider, MD    Physical Exam: Filed Vitals:   02/19/13 0300 02/19/13 0332 02/19/13 0346 02/19/13 0417  BP: 103/62 101/68 117/73 115/71  Pulse: 94 92 103 90  Temp:    99 F (37.2 C)  TempSrc:    Oral  Resp:    20  Height:    5\' 2"  (1.575 m)  Weight:    91.354 kg (201 lb 6.4 oz)  SpO2: 95% 95% 98% 96%    General: Alert, Awake and Oriented to Time, Place and Person. Appear in moderate distress Eyes: PERRl ENT: Oral Mucosa clear dry. Neck: No JVD, no Carotid Bruits  Cardiovascular: S1 and S2 Present, aortic systolic Murmur, Peripheral Pulses Present Respiratory: Bilateral Air entry equal and Decreased, Clear to Auscultation,  no Crackles, no wheezes Abdomen: Bowel Sound Present, Soft and Non tender Skin: right leg redness up to knee, 2 diffuse swelling on the dorsum of the foot with draining serosanguineous discharge. Extremities:  bilateral Pedal edema,  Mild calf tenderness Neurologic: Grossly Unremarkable.  Labs on Admission:  CBC:  Recent Labs Lab 02/17/13 1419 02/19/13 0146 02/19/13 0151  WBC 9.8 7.5  --   NEUTROABS 7.4  --   --   HGB 10.9* 10.2* 10.9*  HCT 31.6* 30.1*  32.0*  MCV 89.3 90.4  --   PLT 357 438*  --     CMP     Component Value Date/Time   NA 136 02/19/2013 0151   K 3.5 02/19/2013 0151   CL 103 02/19/2013 0151   CO2 26 02/19/2013 0146   GLUCOSE 100* 02/19/2013 0151   BUN 9 02/19/2013 0151   CREATININE 1.00 02/19/2013 0151   CALCIUM 8.5 02/19/2013 0146   PROT 6.3 02/19/2013 0146   ALBUMIN 2.5* 02/19/2013 0146   AST 26 02/19/2013 0146   ALT 34 02/19/2013 0146   ALKPHOS 100 02/19/2013 0146   BILITOT <0.1* 02/19/2013 0146   GFRNONAA >90 02/19/2013 0146   GFRAA >90 02/19/2013 0146    No results found for this basename: LIPASE, AMYLASE,  in the last 168 hours No results found for this basename:  AMMONIA,  in the last 168 hours  Cardiac Enzymes: No results found for this basename: CKTOTAL, CKMB, CKMBINDEX, TROPONINI,  in the last 168 hours  BNP (last 3 results) No results found for this basename: PROBNP,  in the last 8760 hours  Radiological Exams on Admission: Dg Foot Complete Right  02/19/2013   *RADIOLOGY REPORT*  Clinical Data: Foot pain.  Rule out osteomyelitis.  RIGHT FOOT COMPLETE - 3+ VIEW  Comparison: None.  Findings: There is marked soft tissue swelling along the dorsum of the foot.  There is a gas and fluid dorsal to the midfoot tarsal bones.  Possible fluid collection measures 38 x 4.3 cm.  No underlying bony erosion identified.  Postoperative changes involving the proximal phalanx and distal aspect of the fifth metatarsal.  IMPRESSION:  1.  There is a out possible gas and fluid collection along the dorsum of the midfoot.  Advise further evaluation with cross- sectional imaging.  The study of choice would be a contrast- enhanced MRI or contrast enhanced CT. 2.  No focal bony erosions identified.   Original Report Authenticated By: Signa Kell, M.D.    Assessment/Plan Principal Problem:   Osteomyelitis Active Problems:   Generalized anxiety disorder   Asthma, chronic   1. Osteomyelitis  the patient appears to cellulitis versus osteomyelitis of the  foot. She is not diabetic she has been treated with oral Bactrim which has not been beneficial. There is also a possibility of spider bite or any other bug bite which could potentially lead to necrotizing cellulitis. currently orthopedics has been consulted and will follow up with the patient I will keep the patient n.p.o. .  I would hydrate the patient with IV fluids. I will broaden the coverage to vancomycin Zosyn. Wound culture has been sent. Blood cultures are sent.  2.Anxiety  Continue Valium   3. pain control Dilaudid as needed  4. asthma   continue home dose of inhalers  DVT Prophylaxis: subcutaneous Heparin Nutrition:  n.p.o.   Code Status:  full    Author: Lynden Oxford, MD [facility] Pager: [phone] 02/19/2013, 5:27 AM    If 7PM-7AM, please contact night-coverage [email or web address] Password [email or web address]

## 2013-02-19 NOTE — Consult Note (Signed)
Reason for Consult:  Right foot pain and swelling Referring Physician:  Dr. Waldon Merl is an 42 y.o. female.  HPI:  42 y/o female with PMH of asthma c/o right foot swelling starting a week ago.  She first noted itching on the dorsum of her foot that progressed to a "tight feeling".  Over the last few days she developed redness and increasing tenderness and swelling.  Last night the top of her foot began to bleed, so she went to the ER.  She c/o low grade fever but no nausea or vomiting.  She denies any h/o previous foot infection.   She has a h/o smoking but no diabetes.  She had a previous bunion surgery by Dr. Harriet Pho.  On vanc / zosyn IV.  Past Medical History  Diagnosis Date  . Asthma   . Depression   . ADD (attention deficit disorder)   . H/O miscarriage, not currently pregnant   . Hemorrhoids, internal     Past Surgical History  Procedure Laterality Date  . Foot surgery      FH:  Non contributory  Social History:  reports that she has quit smoking. Her smoking use included Cigarettes. She smoked 0.01 packs per day. She does not have any smokeless tobacco history on file. She reports that she does not drink alcohol or use illicit drugs.  Allergies: No Known Allergies  Medications: I have reviewed the patient's current medications.  Results for orders placed during the hospital encounter of 02/19/13 (from the past 48 hour(s))  CBC     Status: Abnormal   Collection Time    02/19/13  1:46 AM      Result Value Range   WBC 7.5  4.0 - 10.5 K/uL   RBC 3.33 (*) 3.87 - 5.11 MIL/uL   Hemoglobin 10.2 (*) 12.0 - 15.0 g/dL   HCT 40.9 (*) 81.1 - 91.4 %   MCV 90.4  78.0 - 100.0 fL   MCH 30.6  26.0 - 34.0 pg   MCHC 33.9  30.0 - 36.0 g/dL   RDW 78.2  95.6 - 21.3 %   Platelets 438 (*) 150 - 400 K/uL  COMPREHENSIVE METABOLIC PANEL     Status: Abnormal   Collection Time    02/19/13  1:46 AM      Result Value Range   Sodium 136  135 - 145 mEq/L   Potassium 3.5  3.5 - 5.1  mEq/L   Comment: DELTA CHECK NOTED   Chloride 99  96 - 112 mEq/L   CO2 26  19 - 32 mEq/L   Glucose, Bld 97  70 - 99 mg/dL   BUN 11  6 - 23 mg/dL   Creatinine, Ser 0.86  0.50 - 1.10 mg/dL   Calcium 8.5  8.4 - 57.8 mg/dL   Total Protein 6.3  6.0 - 8.3 g/dL   Albumin 2.5 (*) 3.5 - 5.2 g/dL   AST 26  0 - 37 U/L   ALT 34  0 - 35 U/L   Alkaline Phosphatase 100  39 - 117 U/L   Total Bilirubin <0.1 (*) 0.3 - 1.2 mg/dL   GFR calc non Af Amer >90  >90 mL/min   GFR calc Af Amer >90  >90 mL/min   Comment: (NOTE)     The eGFR has been calculated using the CKD EPI equation.     This calculation has not been validated in all clinical situations.     eGFR's persistently <90  mL/min signify possible Chronic Kidney     Disease.  POCT I-STAT, CHEM 8     Status: Abnormal   Collection Time    02/19/13  1:51 AM      Result Value Range   Sodium 136  135 - 145 mEq/L   Potassium 3.5  3.5 - 5.1 mEq/L   Chloride 103  96 - 112 mEq/L   BUN 9  6 - 23 mg/dL   Creatinine, Ser 1.61  0.50 - 1.10 mg/dL   Glucose, Bld 096 (*) 70 - 99 mg/dL   Calcium, Ion 0.45 (*) 1.12 - 1.23 mmol/L   TCO2 24  0 - 100 mmol/L   Hemoglobin 10.9 (*) 12.0 - 15.0 g/dL   HCT 40.9 (*) 81.1 - 91.4 %  CG4 I-STAT (LACTIC ACID)     Status: None   Collection Time    02/19/13  1:51 AM      Result Value Range   Lactic Acid, Venous 1.14  0.5 - 2.2 mmol/L  WOUND CULTURE     Status: None   Collection Time    02/19/13  4:54 AM      Result Value Range   Specimen Description WOUND FOOT RIGHT     Special Requests NONE     Gram Stain       Value: RARE WBC PRESENT,BOTH PMN AND MONONUCLEAR     NO SQUAMOUS EPITHELIAL CELLS SEEN     RARE GRAM POSITIVE COCCI IN CLUSTERS     Performed at [facility]   Culture PENDING     Report Status PENDING    COMPREHENSIVE METABOLIC PANEL     Status: Abnormal   Collection Time    02/19/13  5:00 AM      Result Value Range   Sodium 136  135 - 145 mEq/L   Potassium 3.5  3.5 - 5.1 mEq/L   Chloride  100  96 - 112 mEq/L   CO2 25  19 - 32 mEq/L   Glucose, Bld 78  70 - 99 mg/dL   BUN 9  6 - 23 mg/dL   Creatinine, Ser 7.82  0.50 - 1.10 mg/dL   Calcium 8.5  8.4 - 95.6 mg/dL   Total Protein 6.0  6.0 - 8.3 g/dL   Albumin 2.5 (*) 3.5 - 5.2 g/dL   AST 30  0 - 37 U/L   ALT 34  0 - 35 U/L   Alkaline Phosphatase 97  39 - 117 U/L   Total Bilirubin 0.1 (*) 0.3 - 1.2 mg/dL   GFR calc non Af Amer >90  >90 mL/min   GFR calc Af Amer >90  >90 mL/min   Comment: (NOTE)     The eGFR has been calculated using the CKD EPI equation.     This calculation has not been validated in all clinical situations.     eGFR's persistently <90 mL/min signify possible Chronic Kidney     Disease.  CBC     Status: Abnormal   Collection Time    02/19/13  5:00 AM      Result Value Range   WBC 7.6  4.0 - 10.5 K/uL   RBC 3.18 (*) 3.87 - 5.11 MIL/uL   Hemoglobin 9.5 (*) 12.0 - 15.0 g/dL   HCT 21.3 (*) 08.6 - 57.8 %   MCV 90.6  78.0 - 100.0 fL   MCH 29.9  26.0 - 34.0 pg   MCHC 33.0  30.0 - 36.0 g/dL   RDW  14.0  11.5 - 15.5 %   Platelets 459 (*) 150 - 400 K/uL  PROTIME-INR     Status: None   Collection Time    02/19/13  5:00 AM      Result Value Range   Prothrombin Time 14.4  11.6 - 15.2 seconds   INR 1.14  0.00 - 1.49  APTT     Status: Abnormal   Collection Time    02/19/13  5:00 AM      Result Value Range   aPTT 39 (*) 24 - 37 seconds   Comment:            IF BASELINE aPTT IS ELEVATED,     SUGGEST PATIENT RISK ASSESSMENT     BE USED TO DETERMINE APPROPRIATE     ANTICOAGULANT THERAPY.  GLUCOSE, CAPILLARY     Status: None   Collection Time    02/19/13  8:15 AM      Result Value Range   Glucose-Capillary 72  70 - 99 mg/dL   Comment 1 Notify RN      Dg Foot Complete Right  02/19/2013   *RADIOLOGY REPORT*  Clinical Data: Foot pain.  Rule out osteomyelitis.  RIGHT FOOT COMPLETE - 3+ VIEW  Comparison: None.  Findings: There is marked soft tissue swelling along the dorsum of the foot.  There is a gas and  fluid dorsal to the midfoot tarsal bones.  Possible fluid collection measures 38 x 4.3 cm.  No underlying bony erosion identified.  Postoperative changes involving the proximal phalanx and distal aspect of the fifth metatarsal.  IMPRESSION:  1.  There is a out possible gas and fluid collection along the dorsum of the midfoot.  Advise further evaluation with cross- sectional imaging.  The study of choice would be a contrast- enhanced MRI or contrast enhanced CT. 2.  No focal bony erosions identified.   Original Report Authenticated By: Signa Kell, M.D.    ROS:  As above. PE:  Blood pressure 91/46, pulse 90, temperature 99.1 F (37.3 C), temperature source Axillary, resp. rate 18, height 5\' 2"  (1.575 m), weight 91.354 kg (201 lb 6.4 oz), SpO2 91.00%. wn wd female in nad.  A and O x 4.  Mood and affect normal.  EOMI.  resp unlabored.  R foot with severe swelling.  Two soft areas with skin very thin overlying those areas.  There is copious serous drainage but no purulence.  Brisk cap refill at toes.  TTP at dorsum of foot.  5/5 strength in dorsiflexion of the ankle and toes.  No lymphadenopathy.  Desquamation across the dorsum of the foot.  ERythema is resolving based on the markings from the ER.  Sensability to LT intact dorsally and plantarly.   Assessment/Plan: R dorsal foot cellulitis with spontaneously draining superficial abscesses.  Given the improvement of the cellulitis symptoms and the draining abscess, I'm inclined to hold off on any operative treatment for now.  I believe it's necessary to order an MRI to eval for any deep abscess though her exam is not consistent with a deep abscess.  She seems to be responding to the IV abx appropriately, and she does not appear to be systemically ill from the cellulitis.   Toni Arthurs 02/19/2013, 5:01 PM

## 2013-02-19 NOTE — Progress Notes (Addendum)
[facility] PROGRESS NOTE  Tracey Ward ZOX:[id number] DOB: 1970/12/14 DOA: 03-17-2013 PCP: [name], MD  Assessment/Plan: 1-Right foot infection/ abscess: will defer to ortho to order MRI. Continue with IV antibiotics. Addendum: Dr Charlann Boxer consulted, per Dr Charlann Boxer, Dr hewitt will see patient.    2-Asthma: no evidence of exacerbation. Continue with albuterol.  3-History of anxiety, ADD: Continue with valium PRN, Ritalin.  4-DVT prophylaxis: Lovenox.   Code Status: Full Code.  Family Communication: Care discussed with husband who was at bedside.  Disposition Plan: remain inpatient.    Consultants:  Ortho.   Procedures:  none  Antibiotics:  Vancomycin 9-9  Zosyn 9-9   HPI/Subjective: No worsening pain. Feels she needs to have a BM. Passing gas.   Objective: Filed Vitals:   Mar 17, 2013 0417  BP: 115/71  Pulse: 90  Temp: 99 F (37.2 C)  Resp: 20    Intake/Output Summary (Last 24 hours) at 03/17/2013 1055 Last data filed at 03/17/13 0900  Gross per 24 hour  Intake      0 ml  Output      0 ml  Net      0 ml   Filed Weights   02/18/13 2344 2013/03/17 0417  Weight: 72.576 kg (160 lb) 91.354 kg (201 lb 6.4 oz)    Exam:   General:  No distress, lying  in bed.   Cardiovascular: S 1, S 2 RRR  Respiratory: CTA  Abdomen: BS present, soft, NT  Musculoskeletal: right foot with dressing, swelling   Data Reviewed: Basic Metabolic Panel:  Recent Labs Lab 02/17/13 1419 17-Mar-2013 0146 03-17-13 0151 03-17-13 0500  NA 136 136 136 136  K 2.9* 3.5 3.5 3.5  CL 98 99 103 100  CO2 27 26  --  25  GLUCOSE 146* 97 100* 78  BUN 8 11 9 9   CREATININE 0.57 0.77 1.00 0.76  CALCIUM 8.5 8.5  --  8.5   Liver Function Tests:  Recent Labs Lab 02/17/13 1419 03-17-13 0146 17-Mar-2013 0500  AST 30 26 30   ALT 35 34 34  ALKPHOS 110 100 97  BILITOT <0.1* <0.1* 0.1*  PROT 6.4 6.3 6.0  ALBUMIN 2.5* 2.5* 2.5*   No results found for this basename: LIPASE, AMYLASE,  in  the last 168 hours No results found for this basename: AMMONIA,  in the last 168 hours CBC:  Recent Labs Lab 02/17/13 1419 17-Mar-2013 0146 03/17/2013 0151 2013/03/17 0500  WBC 9.8 7.5  --  7.6  NEUTROABS 7.4  --   --   --   HGB 10.9* 10.2* 10.9* 9.5*  HCT 31.6* 30.1* 32.0* 28.8*  MCV 89.3 90.4  --  90.6  PLT 357 438*  --  459*   Cardiac Enzymes: No results found for this basename: CKTOTAL, CKMB, CKMBINDEX, TROPONINI,  in the last 168 hours BNP (last 3 results) No results found for this basename: PROBNP,  in the last 8760 hours CBG:  Recent Labs Lab 2013-03-17 0815  GLUCAP 72    No results found for this or any previous visit (from the past 240 hour(s)).   Studies: Dg Foot Complete Right  2013-03-17   *RADIOLOGY REPORT*  Clinical Data: Foot pain.  Rule out osteomyelitis.  RIGHT FOOT COMPLETE - 3+ VIEW  Comparison: None.  Findings: There is marked soft tissue swelling along the dorsum of the foot.  There is a gas and fluid dorsal to the midfoot tarsal bones.  Possible fluid collection measures 38 x 4.3 cm.  No underlying bony erosion identified.  Postoperative changes involving the proximal phalanx and distal aspect of the fifth metatarsal.  IMPRESSION:  1.  There is a out possible gas and fluid collection along the dorsum of the midfoot.  Advise further evaluation with cross- sectional imaging.  The study of choice would be a contrast- enhanced MRI or contrast enhanced CT. 2.  No focal bony erosions identified.   Original Report Authenticated By: Signa Kell, M.D.    Scheduled Meds: . enoxaparin (LOVENOX) injection  40 mg Subcutaneous Daily  . fluticasone  2 puff Inhalation BID  . methylphenidate  10 mg Oral BID  . pantoprazole  40 mg Oral BID AC  . piperacillin-tazobactam (ZOSYN)  IV  3.375 g Intravenous Q8H  . vancomycin  1,250 mg Intravenous Q12H  . Vilazodone HCl  20 mg Oral Daily   Continuous Infusions: . sodium chloride 125 mL/hr at 02/19/13 0208    Principal Problem:    Osteomyelitis Active Problems:   Generalized anxiety disorder   Asthma, chronic    Time spent: 25 minutes.     Tracey Ward  [facility] Pager [phone]. If 7PM-7AM, please contact night-coverage at [email or web address], password [facility] 02/19/2013, 10:55 AM  LOS: 0 days

## 2013-02-20 ENCOUNTER — Inpatient Hospital Stay (HOSPITAL_COMMUNITY): Payer: BC Managed Care – PPO

## 2013-02-20 LAB — GLUCOSE, CAPILLARY: Glucose-Capillary: 104 mg/dL — ABNORMAL HIGH (ref 70–99)

## 2013-02-20 MED ORDER — LORAZEPAM 2 MG/ML IJ SOLN
1.0000 mg | Freq: Once | INTRAMUSCULAR | Status: AC
  Administered 2013-02-20: 1 mg via INTRAVENOUS
  Filled 2013-02-20: qty 1

## 2013-02-20 MED ORDER — HYDROMORPHONE HCL PF 1 MG/ML IJ SOLN
2.0000 mg | INTRAMUSCULAR | Status: DC | PRN
  Administered 2013-02-20 – 2013-02-23 (×16): 2 mg via INTRAVENOUS
  Filled 2013-02-20 (×2): qty 2
  Filled 2013-02-20: qty 1
  Filled 2013-02-20 (×12): qty 2
  Filled 2013-02-20: qty 1
  Filled 2013-02-20 (×2): qty 2

## 2013-02-20 MED ORDER — GADOBENATE DIMEGLUMINE 529 MG/ML IV SOLN
20.0000 mL | Freq: Once | INTRAVENOUS | Status: AC
  Administered 2013-02-20: 19 mL via INTRAVENOUS

## 2013-02-20 MED ORDER — PNEUMOCOCCAL VAC POLYVALENT 25 MCG/0.5ML IJ INJ
0.5000 mL | INJECTION | INTRAMUSCULAR | Status: AC
  Administered 2013-02-21: 0.5 mL via INTRAMUSCULAR
  Filled 2013-02-20: qty 0.5

## 2013-02-20 NOTE — Progress Notes (Signed)
Subjective: Pt says right foot still feels sore and tight.  No f/c/n/v.  MRI done this morning.  Pt's husband is at bedside.     Objective: Vital signs in last 24 hours: Temp:  [97.9 F (36.6 C)-100.2 F (37.9 C)] 98.1 F (36.7 C) 03/17/23 0519) Pulse Rate:  [90-97] 96 17-Mar-2023 0519) Resp:  [18] 18 03/17/23 0519) BP: (91-123)/(46-99) 123/80 mmHg 2023/03/17 0519) SpO2:  [91 %-99 %] 99 % 03-17-23 0519)  Intake/Output from previous day: 09/09 0701 - March 17, 2023 0700 In: 4848.8 [P.O.:480; I.V.:3418.8; IV Piggyback:950] Out: -  Intake/Output this shift:     Recent Labs  02/17/13 1419 02/19/13 0146 02/19/13 0151 02/19/13 0500  HGB 10.9* 10.2* 10.9* 9.5*    Recent Labs  02/19/13 0146 02/19/13 0151 02/19/13 0500  WBC 7.5  --  7.6  RBC 3.33*  --  3.18*  HCT 30.1* 32.0* 28.8*  PLT 438*  --  459*    Recent Labs  02/19/13 0146 02/19/13 0151 02/19/13 0500  NA 136 136 136  K 3.5 3.5 3.5  CL 99 103 100  CO2 26  --  25  BUN 11 9 9   CREATININE 0.77 1.00 0.76  GLUCOSE 97 100* 78  CALCIUM 8.5  --  8.5    Recent Labs  02/19/13 0500  INR 1.14    PE:  right dorsal foot with significant swelling.  Moderate serous drainage.  Skin slough is progressing.  Brisk cap refill at the toes.  5/5 strength in PF and DF of the ankle.  Sens to LT intact at the dorsal foot.  No lymphadenopathy.  MRI of the foot with and without contrast shows a large subcutaneous abscess across the midfoot.  No evidence of osteomyelitis.  Assessment/Plan: Right dorsal foot abscess - to OR for I and D of abscess with possible wound VAC placement.  I explained the nature of the abscess to the pt in detail.  We discussed surgical and non surgical treatment options in detail.  The risks and benefits of the alternative treatment options have been discussed in detail.  The patient wishes to proceed with surgery and specifically understands risks of bleeding, infection, nerve damage, blood clots, need for additional surgery,  amputation and death.    Tracey Ward 03-16-2013, 12:38 PM

## 2013-02-20 NOTE — Progress Notes (Signed)
[facility] PROGRESS NOTE  Tracey Ward ZOX:[id number] DOB: 1970-11-29 DOA: 02/19/2013 PCP: [name], MD  Assessment/Plan: Principal Problem:   Osteomyelitis Active Problems:   Generalized anxiety disorder   Asthma, chronic    Assessment/Plan:  1. Right foot infection/Abscess: Patient presented with progressive swelling, pain and redness of her right foot, chills, and a borderline wcc. X-ray of the affected foot revealed possible gas and fluid collection along the  dorsum of the midfoot. Managing with iv Vancomycin/Zosyn, now day #3. Dr Toni Arthurs provided orthopedic consultation. Managing as recommended. MRI revealed large dorsal abscess of the foot overling the midfoot and measuring approximately 7 cm transverse by 1.6 cm craniocaudad by 3.6 cm proximal to distal. It extends in a crescentic fashion in the dorsal subcutaneous tissues along the midfoot. There was extensive associated cellulitis. It appears surgical intervention is indicated. Will discuss with Dr Victorino Dike.  2. Asthma: No evidence of exacerbation. Continued on Albuterol.  3. History of anxiety/ADD: Continue with valium PRN, Ritalin.    Code Status: Full Code.  Family Communication:  Disposition Plan: To be determined.    Brief narrative: 42 y.o. female with Past medical history of asthma, depression, ADD, internal hemorrhoids, presenting with complaint of worsening of her right foot swelling that has been ongoing since last one week. While at her sister's house on 02/14/13, at around midnight, she noted right foot itching and warmth. Later on she also had some pain and she saw the PCP, who initially felt it could be an allergic reaction, but later prescribed her Bactrim. After that she had at trip to [place]. She denies soaking her food in the sea water. She denies any seafood ingestion or bug bites. After that, since she was having significantly difficulty walking she presented to the [facility] where she was  given oral Keflex and oral Percocet. Despite that, she continued to have progressively worsening swelling and redness of the leg with some fever. Admitted for further management.    Consultants:  Dr Toni Arthurs, orthopedics.   Procedures:  X-Ray Right foot.  MRI right Foot.   Antibiotics:  Clindamycin 02/18/13.   Zosyn 02/18/13>>>  Vancomycin 02/18/13>>>  HPI/Subjective: In significant acute pain.   Objective: Vital signs in last 24 hours: Temp:  [97.9 F (36.6 C)-100.2 F (37.9 C)] 98.1 F (36.7 C) (09/10 0519) Pulse Rate:  [90-97] 96 (09/10 0519) Resp:  [18] 18 (09/10 0519) BP: (91-123)/(46-99) 123/80 mmHg (09/10 0519) SpO2:  [91 %-99 %] 99 % (09/10 0519) Weight change:  Last BM Date: 02/19/13  Intake/Output from previous day: 09/09 0701 - 09/10 0700 In: 4848.8 [P.O.:480; I.V.:3418.8; IV Piggyback:950] Out: -      Physical Exam: General: Tearful, alert, communicative, fully oriented, not short of breath at rest.  HEENT:  Mild clinical pallor, no jaundice, no conjunctival injection or discharge. NECK:  Supple, JVP not seen, no carotid bruits, no palpable lymphadenopathy, no palpable goiter. Hydration is fair.  CHEST:  Clinically clear to auscultation, no wheezes, no crackles. HEART:  Sounds 1 and 2 heard, normal, regular, no murmurs. ABDOMEN:  Moderately obese, soft, non-tender, no palpable organomegaly, no palpable masses, normal bowel sounds. GENITALIA:  Not examined. LOWER EXTREMITIES:  No pitting edema, palpable peripheral pulses. MUSCULOSKELETAL SYSTEM:  Marked swelling of right foot, with tenderness, and multiple puncti on dorsum, with perifocal fluctuance. Erythema of lower third of lower leg, has improved. CENTRAL NERVOUS SYSTEM:  No focal neurologic deficit on gross examination.  Lab Results:  Recent Labs  02/19/13 0146 02/19/13 0151 02/19/13 0500  WBC 7.5  --  7.6  HGB 10.2* 10.9* 9.5*  HCT 30.1* 32.0* 28.8*  PLT 438*  --  459*    Recent  Labs  02/19/13 0146 02/19/13 0151 02/19/13 0500  NA 136 136 136  K 3.5 3.5 3.5  CL 99 103 100  CO2 26  --  25  GLUCOSE 97 100* 78  BUN 11 9 9   CREATININE 0.77 1.00 0.76  CALCIUM 8.5  --  8.5   Recent Results (from the past 240 hour(s))  WOUND CULTURE     Status: None   Collection Time    02/19/13  4:54 AM      Result Value Range Status   Specimen Description WOUND FOOT RIGHT   Final   Special Requests NONE   Final   Gram Stain     Final   Value: RARE WBC PRESENT,BOTH PMN AND MONONUCLEAR     NO SQUAMOUS EPITHELIAL CELLS SEEN     RARE GRAM POSITIVE COCCI IN CLUSTERS     Performed at [facility]   Culture PENDING   Incomplete   Report Status PENDING   Incomplete     Studies/Results: Dg Foot Complete Right  02/19/2013   *RADIOLOGY REPORT*  Clinical Data: Foot pain.  Rule out osteomyelitis.  RIGHT FOOT COMPLETE - 3+ VIEW  Comparison: None.  Findings: There is marked soft tissue swelling along the dorsum of the foot.  There is a gas and fluid dorsal to the midfoot tarsal bones.  Possible fluid collection measures 38 x 4.3 cm.  No underlying bony erosion identified.  Postoperative changes involving the proximal phalanx and distal aspect of the fifth metatarsal.  IMPRESSION:  1.  There is a out possible gas and fluid collection along the dorsum of the midfoot.  Advise further evaluation with cross- sectional imaging.  The study of choice would be a contrast- enhanced MRI or contrast enhanced CT. 2.  No focal bony erosions identified.   Original Report Authenticated By: Signa Kell, M.D.    Medications: Scheduled Meds: . enoxaparin (LOVENOX) injection  40 mg Subcutaneous Daily  . fluticasone  2 puff Inhalation BID  . LORazepam  1 mg Intravenous Once  . methylphenidate  10 mg Oral BID  . pantoprazole  40 mg Oral BID AC  . piperacillin-tazobactam (ZOSYN)  IV  3.375 g Intravenous Q8H  . polyethylene glycol  17 g Oral BID  . vancomycin  1,250 mg Intravenous Q12H  .  Vilazodone HCl  20 mg Oral Daily   Continuous Infusions: . sodium chloride 125 mL/hr at 02/20/13 0529   PRN Meds:.acetaminophen, albuterol, bisacodyl, diazepam, diphenhydrAMINE, guaiFENesin, HYDROmorphone (DILAUDID) injection, ondansetron (ZOFRAN) IV, ondansetron, oxyCODONE    LOS: 1 day   Jaquil Todt,CHRISTOPHER  [facility] Pager [phone]. If 8PM-8AM, please contact night-coverage at [email or web address], password [facility] 02/20/2013, 7:38 AM  LOS: 1 day

## 2013-02-21 ENCOUNTER — Encounter (HOSPITAL_COMMUNITY): Payer: Self-pay | Admitting: Anesthesiology

## 2013-02-21 ENCOUNTER — Inpatient Hospital Stay (HOSPITAL_COMMUNITY): Payer: BC Managed Care – PPO | Admitting: Anesthesiology

## 2013-02-21 ENCOUNTER — Encounter (HOSPITAL_COMMUNITY)
Admission: EM | Disposition: A | Discharge status: Home / Self Care | Payer: Self-pay | Source: Home / Self Care | Attending: Internal Medicine

## 2013-02-21 HISTORY — PX: I&D EXTREMITY: SHX5045

## 2013-02-21 LAB — CBC
Hemoglobin: 9.6 g/dL — ABNORMAL LOW (ref 12.0–15.0)
MCH: 29.7 pg (ref 26.0–34.0)
MCV: 92 fL (ref 78.0–100.0)
RBC: 3.23 MIL/uL — ABNORMAL LOW (ref 3.87–5.11)

## 2013-02-21 LAB — BASIC METABOLIC PANEL
CO2: 25 mEq/L (ref 19–32)
Calcium: 8.8 mg/dL (ref 8.4–10.5)
Glucose, Bld: 86 mg/dL (ref 70–99)
Sodium: 137 mEq/L (ref 135–145)

## 2013-02-21 LAB — GLUCOSE, CAPILLARY: Glucose-Capillary: 82 mg/dL (ref 70–99)

## 2013-02-21 LAB — WOUND CULTURE

## 2013-02-21 SURGERY — IRRIGATION AND DEBRIDEMENT EXTREMITY
Anesthesia: General | Site: Foot | Laterality: Right | Wound class: Dirty or Infected

## 2013-02-21 MED ORDER — MIDAZOLAM HCL 2 MG/2ML IJ SOLN: 2.0000 mg | Freq: Once | INTRAMUSCULAR | Status: DC

## 2013-02-21 MED ORDER — HYDROMORPHONE HCL PF 1 MG/ML IJ SOLN
INTRAMUSCULAR | Status: AC
  Filled 2013-02-21: qty 1

## 2013-02-21 MED ORDER — BACITRACIN ZINC 500 UNIT/GM EX OINT
TOPICAL_OINTMENT | CUTANEOUS | Status: AC
  Filled 2013-02-21: qty 15

## 2013-02-21 MED ORDER — LACTATED RINGERS IV SOLN
INTRAVENOUS | Status: DC
  Administered 2013-02-21 (×2): via INTRAVENOUS

## 2013-02-21 MED ORDER — SODIUM CHLORIDE 0.9 % IR SOLN
Status: DC | PRN
  Administered 2013-02-21: 3000 mL

## 2013-02-21 MED ORDER — LACTATED RINGERS IV SOLN
INTRAVENOUS | Status: DC | PRN
  Administered 2013-02-21: 15:00:00 via INTRAVENOUS

## 2013-02-21 MED ORDER — OXYCODONE HCL 5 MG/5ML PO SOLN: 5.0000 mg | Freq: Once | ORAL | Status: DC | PRN

## 2013-02-21 MED ORDER — PROPOFOL 10 MG/ML IV BOLUS
INTRAVENOUS | Status: DC | PRN
  Administered 2013-02-21: 200 mg via INTRAVENOUS

## 2013-02-21 MED ORDER — ONDANSETRON HCL 4 MG/2ML IJ SOLN
INTRAMUSCULAR | Status: DC | PRN
  Administered 2013-02-21: 4 mg via INTRAVENOUS

## 2013-02-21 MED ORDER — SODIUM CHLORIDE 0.9 % IV SOLN
INTRAVENOUS | Status: DC
  Administered 2013-02-21: 05:00:00 via INTRAVENOUS

## 2013-02-21 MED ORDER — VANCOMYCIN HCL IN DEXTROSE 1-5 GM/200ML-% IV SOLN
INTRAVENOUS | Status: AC
  Filled 2013-02-21: qty 200

## 2013-02-21 MED ORDER — FENTANYL CITRATE 0.05 MG/ML IJ SOLN
INTRAMUSCULAR | Status: DC | PRN
  Administered 2013-02-21 (×5): 50 ug via INTRAVENOUS

## 2013-02-21 MED ORDER — OXYCODONE HCL 5 MG PO TABS
ORAL_TABLET | ORAL | Status: AC
  Administered 2013-02-21: 5 mg
  Filled 2013-02-21: qty 1

## 2013-02-21 MED ORDER — GENTAMICIN SULFATE 40 MG/ML IJ SOLN
INTRAMUSCULAR | Status: AC
  Filled 2013-02-21: qty 2

## 2013-02-21 MED ORDER — HYDROMORPHONE HCL PF 1 MG/ML IJ SOLN
0.2500 mg | INTRAMUSCULAR | Status: DC | PRN
  Administered 2013-02-21 (×3): 0.5 mg via INTRAVENOUS

## 2013-02-21 MED ORDER — LIDOCAINE HCL (CARDIAC) 20 MG/ML IV SOLN
INTRAVENOUS | Status: DC | PRN
  Administered 2013-02-21: 100 mg via INTRAVENOUS

## 2013-02-21 MED ORDER — GUAIFENESIN ER 600 MG PO TB12
1200.0000 mg | ORAL_TABLET | Freq: Two times a day (BID) | ORAL | Status: DC
  Administered 2013-02-21 – 2013-02-25 (×4): 1200 mg via ORAL
  Filled 2013-02-21 (×10): qty 2

## 2013-02-21 MED ORDER — PROMETHAZINE HCL 25 MG/ML IJ SOLN: 6.2500 mg | INTRAMUSCULAR | Status: DC | PRN

## 2013-02-21 MED ORDER — ALBUTEROL SULFATE (5 MG/ML) 0.5% IN NEBU
2.5000 mg | INHALATION_SOLUTION | Freq: Four times a day (QID) | RESPIRATORY_TRACT | Status: DC
  Administered 2013-02-21 (×2): 2.5 mg via RESPIRATORY_TRACT
  Filled 2013-02-21 (×2): qty 0.5

## 2013-02-21 MED ORDER — MIDAZOLAM HCL 2 MG/2ML IJ SOLN
INTRAMUSCULAR | Status: AC
  Administered 2013-02-21: 2 mg
  Filled 2013-02-21: qty 2

## 2013-02-21 MED ORDER — MIDAZOLAM HCL 5 MG/5ML IJ SOLN
INTRAMUSCULAR | Status: DC | PRN
  Administered 2013-02-21: 2 mg via INTRAVENOUS

## 2013-02-21 MED ORDER — OXYCODONE HCL 5 MG PO TABS: 5.0000 mg | ORAL_TABLET | Freq: Once | ORAL | Status: DC | PRN

## 2013-02-21 SURGICAL SUPPLY — 50 items
BANDAGE CONFORM 3  STR LF (GAUZE/BANDAGES/DRESSINGS) ×2 IMPLANT
BANDAGE ELASTIC 4 VELCRO ST LF (GAUZE/BANDAGES/DRESSINGS) ×2 IMPLANT
BANDAGE ESMARK 6X9 LF (GAUZE/BANDAGES/DRESSINGS) ×1 IMPLANT
BANDAGE GAUZE ELAST BULKY 4 IN (GAUZE/BANDAGES/DRESSINGS) ×2 IMPLANT
BLADE SURG 10 STRL SS (BLADE) ×2 IMPLANT
BNDG COHESIVE 4X5 TAN STRL (GAUZE/BANDAGES/DRESSINGS) ×2 IMPLANT
BNDG COHESIVE 6X5 TAN STRL LF (GAUZE/BANDAGES/DRESSINGS) ×2 IMPLANT
BNDG ESMARK 6X9 LF (GAUZE/BANDAGES/DRESSINGS) ×2
CHLORAPREP W/TINT 26ML (MISCELLANEOUS) ×2 IMPLANT
CLOTH BEACON ORANGE TIMEOUT ST (SAFETY) ×2 IMPLANT
COVER SURGICAL LIGHT HANDLE (MISCELLANEOUS) ×2 IMPLANT
CUFF TOURNIQUET SINGLE 34IN LL (TOURNIQUET CUFF) ×2 IMPLANT
CUFF TOURNIQUET SINGLE 44IN (TOURNIQUET CUFF) IMPLANT
DRAIN PENROSE 1/2X12 LTX STRL (WOUND CARE) ×2 IMPLANT
DRAPE U-SHAPE 47X51 STRL (DRAPES) ×2 IMPLANT
DRSG ADAPTIC 3X8 NADH LF (GAUZE/BANDAGES/DRESSINGS) ×2 IMPLANT
DRSG PAD ABDOMINAL 8X10 ST (GAUZE/BANDAGES/DRESSINGS) ×2 IMPLANT
ELECT REM PT RETURN 9FT ADLT (ELECTROSURGICAL) ×2
ELECTRODE REM PT RTRN 9FT ADLT (ELECTROSURGICAL) ×1 IMPLANT
GLOVE BIO SURGEON STRL SZ8 (GLOVE) ×2 IMPLANT
GLOVE BIOGEL PI IND STRL 8 (GLOVE) ×1 IMPLANT
GLOVE BIOGEL PI INDICATOR 8 (GLOVE) ×1
GOWN PREVENTION PLUS XLARGE (GOWN DISPOSABLE) ×2 IMPLANT
GOWN STRL NON-REIN LRG LVL3 (GOWN DISPOSABLE) ×4 IMPLANT
KIT BASIN OR (CUSTOM PROCEDURE TRAY) ×2 IMPLANT
KIT ROOM TURNOVER OR (KITS) ×2 IMPLANT
MANIFOLD NEPTUNE II (INSTRUMENTS) ×2 IMPLANT
NS IRRIG 1000ML POUR BTL (IV SOLUTION) ×2 IMPLANT
PACK ORTHO EXTREMITY (CUSTOM PROCEDURE TRAY) ×2 IMPLANT
PAD ARMBOARD 7.5X6 YLW CONV (MISCELLANEOUS) ×4 IMPLANT
PAD CAST 4YDX4 CTTN HI CHSV (CAST SUPPLIES) ×1 IMPLANT
PADDING CAST COTTON 4X4 STRL (CAST SUPPLIES) ×1
SOAP 2 % CHG 4 OZ (WOUND CARE) ×2 IMPLANT
SPONGE GAUZE 4X4 12PLY (GAUZE/BANDAGES/DRESSINGS) ×2 IMPLANT
SPONGE LAP 18X18 X RAY DECT (DISPOSABLE) ×2 IMPLANT
STAPLER VISISTAT 35W (STAPLE) IMPLANT
SUCTION FRAZIER TIP 10 FR DISP (SUCTIONS) ×2 IMPLANT
SUT ETHILON 2 0 FS 18 (SUTURE) ×2 IMPLANT
SUT ETHILON 3 0 FSL (SUTURE) IMPLANT
SUT PROLENE 3 0 PS 2 (SUTURE) ×2 IMPLANT
SUT VIC AB 2-0 CT1 27 (SUTURE) ×2
SUT VIC AB 2-0 CT1 TAPERPNT 27 (SUTURE) ×2 IMPLANT
SUT VIC AB 3-0 PS2 18 (SUTURE) ×1
SUT VIC AB 3-0 PS2 18XBRD (SUTURE) ×1 IMPLANT
TOWEL OR 17X24 6PK STRL BLUE (TOWEL DISPOSABLE) ×2 IMPLANT
TOWEL OR 17X26 10 PK STRL BLUE (TOWEL DISPOSABLE) ×2 IMPLANT
TUBE CONNECTING 12X1/4 (SUCTIONS) ×2 IMPLANT
TUBING CYSTO DISP (UROLOGICAL SUPPLIES) ×2 IMPLANT
WATER STERILE IRR 1000ML POUR (IV SOLUTION) ×2 IMPLANT
YANKAUER SUCT BULB TIP NO VENT (SUCTIONS) ×2 IMPLANT

## 2013-02-21 NOTE — Progress Notes (Addendum)
Iv ns d/c and LR started per anesthesia orders for surgery.  Iv site in left forearm unremarkable. Vancomycin and zosyn were completed before NS discontinued and LR started. Dr. Victorino Dike also aware of piercing in right ear that we are unable to remove.

## 2013-02-21 NOTE — Brief Op Note (Signed)
02/19/2013 - 02/21/2013  4:01 PM  PATIENT:  Tracey Ward  42 y.o. female  PRE-OPERATIVE DIAGNOSIS:  Right dorsal foot abscess  POST-OPERATIVE DIAGNOSIS:  same  Procedure(s): Irrigation and excisional debridement of right dorsal foot abscess including multiple areas  SURGEON:  Toni Arthurs, MD  ASSISTANT:  Scott Flowers, PA-C  ANESTHESIA:   General  EBL:  30 cc  TOURNIQUET:  n/a  COMPLICATIONS:  None apparent  DISPOSITION:  Extubated, awake and stable to recovery.  Drain:  [place] x 1  DICTATION ID:  [id number]

## 2013-02-21 NOTE — Anesthesia Postprocedure Evaluation (Signed)
Anesthesia Post Note  Patient: Tracey Ward  Procedure(s) Performed: Procedure(s) (LRB): IRRIGATION AND DEBRIDEMENT  RIGHT FOOT (Right)  Anesthesia type: general  Patient location: PACU  Post pain: Pain level controlled  Post assessment: Patient's Cardiovascular Status Stable  Last Vitals:  Filed Vitals:   02/21/13 1715  BP:   Pulse: 151  Temp: 36.6 C  Resp: 24    Post vital signs: Reviewed and stable  Level of consciousness: sedated  Complications: No apparent anesthesia complications

## 2013-02-21 NOTE — Preoperative (Signed)
Beta Blockers   Reason not to administer Beta Blockers:Not Applicable 

## 2013-02-21 NOTE — Transfer of Care (Signed)
Immediate Anesthesia Transfer of Care Note  Patient: Tracey Ward  Procedure(s) Performed: Procedure(s): IRRIGATION AND DEBRIDEMENT  RIGHT FOOT (Right)  Patient Location: PACU  Anesthesia Type:General  Level of Consciousness: awake, alert  and oriented  Airway & Oxygen Therapy: Patient Spontanous Breathing and Patient connected to nasal cannula oxygen  Post-op Assessment: Report given to PACU RN and Post -op Vital signs reviewed and stable  Post vital signs: Reviewed and stable  Complications: No apparent anesthesia complications

## 2013-02-21 NOTE — Progress Notes (Signed)
[facility] PROGRESS NOTE  Tracey Ward ZOX:[id number] DOB: 18-Jul-1970 DOA: 02/19/2013 PCP: [name], MD  Assessment/Plan: Principal Problem:   Osteomyelitis Active Problems:   Generalized anxiety disorder   Asthma, chronic    Assessment/Plan:  1. Right foot infection/Abscess: Patient presented with progressive swelling, pain and redness of her right foot, chills, and a borderline wcc. X-ray of the affected foot revealed possible gas and fluid collection along the dorsum of the midfoot. Managing with iv Vancomycin/Zosyn, now day #4. MRI revealed large dorsal abscess of the foot overling the midfoot and measuring approximately 7 cm transverse by 1.6 cm craniocaudad by 3.6 cm proximal to distal. It extends in a crescentic fashion in the dorsal subcutaneous tissues along the midfoot. There was extensive associated cellulitis. Dr Victorino Dike provided orthopedic consultation, and I&D/ possible wound vac is scheduled for today. Wound culture grew MRSA. Will discontinue Zosyn.  2. Asthma: Stable/No evidence of exacerbation. Continued on Albuterol.  3. History of anxiety/ADD: Continue with valium PRN, Ritalin.    Code Status: Full Code.  Family Communication:  Disposition Plan: To be determined.    Brief narrative: 42 y.o. female with Past medical history of asthma, depression, ADD, internal hemorrhoids, presenting with complaint of worsening of her right foot swelling that has been ongoing since last one week. While at her sister's house on 02/14/13, at around midnight, she noted right foot itching and warmth. Later on she also had some pain and she saw the PCP, who initially felt it could be an allergic reaction, but later prescribed her Bactrim. After that she had at trip to [place]. She denies soaking her food in the sea water. She denies any seafood ingestion or bug bites. After that, since she was having significantly difficulty walking she presented to the [facility] [place] where she was given  oral Keflex and oral Percocet. Despite that, she continued to have progressively worsening swelling and redness of the leg with some fever. Admitted for further management.    Consultants:  Dr Toni Arthurs, orthopedics.   Procedures:  X-Ray Right foot.  MRI right Foot.   Antibiotics:  Clindamycin 02/18/13.   Zosyn 02/18/13-02/21/13.  Vancomycin 02/18/13>>>  HPI/Subjective: Coughing since last night. Non-productive.   Objective: Vital signs in last 24 hours: Temp:  [97.9 F (36.6 C)-98.6 F (37 C)] 98.3 F (36.8 C) (09/11 0944) Pulse Rate:  [85-91] 90 (09/11 0944) Resp:  [16] 16 (09/11 0514) BP: (104-113)/(63-73) 113/73 mmHg (09/11 0944) SpO2:  [97 %-99 %] 98 % (09/11 0944) Weight change:  Last BM Date: 02/21/13  Intake/Output from previous day: 09/10 0701 - 09/11 0700 In: 1873.3 [I.V.:1873.3] Out: -      Physical Exam: General: Sleeping, but easily rousable. Not in acute pain, not short of breath at rest.  HEENT:  Mild clinical pallor, no jaundice, no conjunctival injection or discharge. NECK:  Supple, JVP not seen, no carotid bruits, no palpable lymphadenopathy, no palpable goiter. Hydration is fair.  CHEST:  Clinically clear to auscultation, no wheezes, few coarse rales. HEART:  Sounds 1 and 2 heard, normal, regular, no murmurs. ABDOMEN:  Moderately obese, soft, non-tender, no palpable organomegaly, no palpable masses, normal bowel sounds. GENITALIA:  Not examined. LOWER EXTREMITIES:  No pitting edema, palpable peripheral pulses. MUSCULOSKELETAL SYSTEM:  Right ankle erythema has resolved. Right foot is under heavy dressings. CENTRAL NERVOUS SYSTEM:  No focal neurologic deficit on gross examination.  Lab Results:  Recent Labs  02/19/13 0500 02/21/13 0710  WBC 7.6 4.6  HGB 9.5*  9.6*  HCT 28.8* 29.7*  PLT 459* 473*    Recent Labs  02/19/13 0500 02/21/13 0710  NA 136 137  K 3.5 4.2  CL 100 104  CO2 25 25  GLUCOSE 78 86  BUN 9 3*  CREATININE 0.76  0.71  CALCIUM 8.5 8.8   Recent Results (from the past 240 hour(s))  WOUND CULTURE     Status: None   Collection Time    02/19/13  4:54 AM      Result Value Range Status   Specimen Description WOUND FOOT RIGHT   Final   Special Requests NONE   Final   Gram Stain     Final   Value: RARE WBC PRESENT,BOTH PMN AND MONONUCLEAR     NO SQUAMOUS EPITHELIAL CELLS SEEN     RARE GRAM POSITIVE COCCI IN CLUSTERS     Performed at [facility]   Culture     Final   Value: MODERATE METHICILLIN RESISTANT STAPHYLOCOCCUS AUREUS     Note: RIFAMPIN AND GENTAMICIN SHOULD NOT BE USED AS SINGLE DRUGS FOR TREATMENT OF STAPH INFECTIONS. This organism DOES NOT demonstrate inducible Clindamycin resistance in vitro. CRITICAL RESULT CALLED TO, READ BACK BY AND VERIFIED WITH: Linus Salmons       ON [id number] BY [facility]     Performed at [facility]   Report Status 02/21/2013 FINAL   Final   Organism ID, Bacteria METHICILLIN RESISTANT STAPHYLOCOCCUS AUREUS   Final     Studies/Results: Mr Foot Right W Wo Contrast  02/20/2013   CLINICAL DATA:  Abscess of right dorsal foot. Swelling. Erythema. Fever.  EXAM: MRI OF THE RIGHT FOREFOOT WITHOUT AND WITH CONTRAST  TECHNIQUE: Multiplanar, multisequence MR imaging was performed both before and after administration of intravenous contrast.  CONTRAST:  19mL MULTIHANCE GADOBENATE DIMEGLUMINE 529 MG/ML IV SOLN  COMPARISON:  02/19/2013  FINDINGS: Dysplastic or postoperative findings noted involving the phalanges. Fifth metatarsal osteotomy noted with a small amount of metal artifact. Metal artifact from prior operative intervention also noted in the great toe.  Survey imaging of the entire foot was performed. Although this has a larger field of view which restricts visualization of smaller structures such as ligaments, it allows for assessment of the of the forefoot and hindfoot.  Large dorsal abscess of the foot overlies the midfoot and measures approximately 7 cm  transverse by 1.6 cm craniocaudad by 3.6 cm proximal to distal. It extends in a crescentic fashion in the dorsal subcutaneous tissues along the midfoot.  Extensive subcutaneous edema and enhancement noted in the foot compatible with cellulitis. No obvious osteomyelitis.  There is tibialis posterior Tina synovitis shown on image 15 of series 5. There is mild common peroneus tendon sheath tenosynovitis and in effusion of the posterior subtalar joint. Small tibiotalar joint effusion noted.  IMPRESSION: 1. Abscess in the subcutaneous tissues dorsal to the midfoot with measurements as above. Extensive surrounding the cellulitis in the foot. No osteomyelitis identified. 2. Postoperative and potentially dysplastic findings involving the phalanges.   Electronically Signed   By: Herbie Baltimore   On: 02/20/2013 09:09    Medications: Scheduled Meds: . fluticasone  2 puff Inhalation BID  . methylphenidate  10 mg Oral BID  . pantoprazole  40 mg Oral BID AC  . piperacillin-tazobactam (ZOSYN)  IV  3.375 g Intravenous Q8H  . pneumococcal 23 valent vaccine  0.5 mL Intramuscular Tomorrow-1000  . polyethylene glycol  17 g Oral BID  . vancomycin  1,250 mg Intravenous Q12H  .  Vilazodone HCl  20 mg Oral Daily   Continuous Infusions: . sodium chloride 100 mL/hr at 02/20/13 1500  . sodium chloride 100 mL/hr at 02/21/13 0504   PRN Meds:.acetaminophen, albuterol, bisacodyl, diazepam, diphenhydrAMINE, guaiFENesin, HYDROmorphone (DILAUDID) injection, ondansetron (ZOFRAN) IV, ondansetron, oxyCODONE    LOS: 2 days   Adalida Garver,CHRISTOPHER  [facility] Pager [phone]. If 8PM-8AM, please contact night-coverage at [email or web address], password [facility] 02/21/2013, 11:20 AM  LOS: 2 days

## 2013-02-21 NOTE — Anesthesia Preprocedure Evaluation (Signed)
Anesthesia Evaluation    Reviewed: Allergy & Precautions, H&P , NPO status , Patient's Chart, lab work & pertinent test results  History of Anesthesia Complications Negative for: history of anesthetic complications  Airway       Dental   Pulmonary asthma , former smoker,          Cardiovascular negative cardio ROS      Neuro/Psych PSYCHIATRIC DISORDERS Depression ADDnegative neurological ROS     GI/Hepatic negative GI ROS, Neg liver ROS,   Endo/Other  negative endocrine ROS  Renal/GU negative Renal ROS     Musculoskeletal   Abdominal   Peds  Hematology negative hematology ROS (+)   Anesthesia Other Findings   Reproductive/Obstetrics                           Anesthesia Physical Anesthesia Plan  ASA: II  Anesthesia Plan: General   Post-op Pain Management:    Induction: Intravenous  Airway Management Planned: LMA  Additional Equipment:   Intra-op Plan:   Post-operative Plan: Extubation in OR  Informed Consent:   Plan Discussed with: CRNA, Anesthesiologist and Surgeon  Anesthesia Plan Comments:         Anesthesia Quick Evaluation

## 2013-02-21 NOTE — Progress Notes (Signed)
Subjective: Pt c/o continue pain in the dorsal right foot.  She denies f/c/n/v.  Cxs have grown MRSA.  Zosyn stopped.   Objective: Vital signs in last 24 hours: Temp:  [98.3 F (36.8 C)-98.6 F (37 C)] 98.6 F (37 C) 03/22/23 1236) Pulse Rate:  [86-91] 91 2023/03/22 1236) Resp:  [14-16] 14 03-22-23 1236) BP: (104-113)/(63-86) 113/86 mmHg 03/22/23 1236) SpO2:  [96 %-99 %] 96 % Mar 22, 2023 1236)  Intake/Output from previous day: 09/10 0701 - 03-22-23 0700 In: 1873.3 [I.V.:1873.3] Out: -  Intake/Output this shift:     Recent Labs  02/19/13 0146 02/19/13 0151 02/19/13 0500 2013/03/21 0710  HGB 10.2* 10.9* 9.5* 9.6*    Recent Labs  02/19/13 0500 2013-03-21 0710  WBC 7.6 4.6  RBC 3.18* 3.23*  HCT 28.8* 29.7*  PLT 459* 473*    Recent Labs  02/19/13 0500 03/21/13 0710  NA 136 137  K 3.5 4.2  CL 100 104  CO2 25 25  BUN 9 3*  CREATININE 0.76 0.71  GLUCOSE 78 86  CALCIUM 8.5 8.8    Recent Labs  02/19/13 0500  INR 1.14    PE:  right dorsal foot with subcutaneous abscess.  Superficial skin slough is stable.  Brisk cap refill at toes.  Sens to LT intact at the toes.  Assessment/Plan: Right foot dorsal abscess - to OR for I and D and possible wound VAC placement.  The risks and benefits of the alternative treatment options have been discussed in detail.  The patient wishes to proceed with surgery and specifically understands risks of bleeding, infection, nerve damage, blood clots, need for additional surgery, amputation and death.    Toni Arthurs 21-Mar-2013, 2:49 PM

## 2013-02-22 ENCOUNTER — Encounter (HOSPITAL_COMMUNITY): Payer: Self-pay | Admitting: Orthopedic Surgery

## 2013-02-22 DIAGNOSIS — D649 Anemia, unspecified: Secondary | ICD-10-CM

## 2013-02-22 LAB — CBC
Hemoglobin: 8.7 g/dL — ABNORMAL LOW (ref 12.0–15.0)
MCH: 29.7 pg (ref 26.0–34.0)
MCHC: 32.3 g/dL (ref 30.0–36.0)
Platelets: 423 10*3/uL — ABNORMAL HIGH (ref 150–400)

## 2013-02-22 LAB — VANCOMYCIN, TROUGH: Vancomycin Tr: 16 ug/mL (ref 10.0–20.0)

## 2013-02-22 LAB — BASIC METABOLIC PANEL
Calcium: 8.5 mg/dL (ref 8.4–10.5)
GFR calc Af Amer: >90 mL/min (ref >90)
GFR calc non Af Amer: >90 mL/min (ref >90)
Glucose, Bld: 98 mg/dL (ref 70–99)
Potassium: 3.9 mEq/L (ref 3.5–5.1)
Sodium: 137 mEq/L (ref 135–145)

## 2013-02-22 LAB — GLUCOSE, CAPILLARY: Glucose-Capillary: 107 mg/dL — ABNORMAL HIGH (ref 70–99)

## 2013-02-22 MED ORDER — ALBUTEROL SULFATE (5 MG/ML) 0.5% IN NEBU
2.5000 mg | INHALATION_SOLUTION | Freq: Two times a day (BID) | RESPIRATORY_TRACT | Status: DC
  Administered 2013-02-22 (×2): 2.5 mg via RESPIRATORY_TRACT
  Filled 2013-02-22 (×3): qty 0.5

## 2013-02-22 MED ORDER — SODIUM CHLORIDE 0.9 % IV BOLUS (SEPSIS)
500.0000 mL | Freq: Once | INTRAVENOUS | Status: AC
  Administered 2013-02-22: 500 mL via INTRAVENOUS

## 2013-02-22 MED ORDER — OXYCODONE HCL 5 MG PO TABS
5.0000 mg | ORAL_TABLET | Freq: Four times a day (QID) | ORAL | Status: DC
  Administered 2013-02-22 – 2013-02-25 (×13): 5 mg via ORAL
  Filled 2013-02-22 (×8): qty 1
  Filled 2013-02-22: qty 2
  Filled 2013-02-22 (×4): qty 1

## 2013-02-22 MED ORDER — VANCOMYCIN HCL 10 G IV SOLR
1500.0000 mg | Freq: Two times a day (BID) | INTRAVENOUS | Status: DC
  Administered 2013-02-22 – 2013-02-25 (×7): 1500 mg via INTRAVENOUS
  Filled 2013-02-22 (×8): qty 1500

## 2013-02-22 NOTE — Op Note (Signed)
NAMELEAHANN, LEMPKE                 ACCOUNT NO.:  [id number]  MEDICAL RECORD NO.:  [id number]  LOCATION:  6N01C                        FACILITY:  [facility]  PHYSICIAN:  Toni Arthurs, MD        DATE OF BIRTH:  1970-10-12  DATE OF PROCEDURE:  02/21/2013 DATE OF DISCHARGE:                              OPERATIVE REPORT   PREOPERATIVE DIAGNOSIS:  Right foot dorsal abscess.  POSTOPERATIVE DIAGNOSIS:  Right foot dorsal abscess.  PROCEDURE:  Irrigation and excisional debridement of right foot dorsal abscesses including multiple areas.  SURGEON:  Toni Arthurs, MD.  FIRST ASSISTANT:  Scott Flowers PA-C.  ANESTHESIA:  General.  ESTIMATED BLOOD LOSS:  30 mL.  TOURNIQUET TIME:  0.  COMPLICATIONS:  None apparent.  DISPOSITION:  Extubated, awake, and stable to recovery.  DRAINS:  Penrose drain x1.  COMPLICATIONS:  None apparent.  DISPOSITION:  Extubated, awake, and stable to recovery.  INDICATIONS FOR PROCEDURE:  The patient is a 42 year old female with a dorsal foot abscess, started with a large dorsal foot abscess including medial and lateral aspects of the foot.  She presents now for operative treatment of this abscess.  She understands the risks and benefits, the alternative treatment options and elects surgical treatment.  She specifically understands the risks of bleeding, infection, nerve damage, blood clots, need for additional surgery, amputation, and death.  PROCEDURE IN DETAIL:  After preoperative consent was obtained, the correct operative site was identified.  The patient was brought to the operating room and placed supine on the operating table.  General anesthesia was induced.  Preoperative antibiotics were administered. Surgical time-out was taken.  Right lower extremity was prepped and draped in standard sterile fashion.  The patient's dorsal foot abscesses were identified.  The longitudinal incision was made over each of the fluctuant areas medially and laterally.   Sharp dissection was carried down through the skin and subcutaneous tissue.  Purulent fluid was immediately expressed from the medial incision.  Excisional debridement was then performed with a scalpel rongeur and curettes.  This removed all of the necrotic fat layer from the dorsum of the foot.  All of the necrotic skin was removed as well and the wound was then irrigated copiously with 3 L of normal saline.  The wound appeared generally healthy and viable at that point.  A 0.5-inch Penrose drain was passed through from the lateral wound under the subcutaneous tissue of the dorsum of the foot to the medial wound.  A 2-0 nylon simple sutures were used to approximate the wound edges medially and laterally.  Sterile dressings were applied followed by a compression wrap.  The patient was awakened by anesthesia and transported to recovery room in stable condition.  FOLLOWUP PLAN:  The patient will be weightbearing as tolerated on that foot and a hard-sole shoe.  She will keep it elevated.  She will continue on antibiotics.  We will plan to remove the drain tomorrow.  Scott Flowers PA-C was present and scrubbed for the duration of the case.  His assistance was critical in obtaining and maintaining exposure performed in the critical portions of the case and closing the wounds.  Toni Arthurs, MD     JH/MEDQ  D:  02/21/2013  T:  02/22/2013  Job:  [id number]

## 2013-02-22 NOTE — Progress Notes (Signed)
Orthopedic Tech Progress Note Patient Details:  Tracey Ward 05-Jul-1970 [id number] Applied post-op shoe to RLE. Ortho Devices Type of Ortho Device: Postop shoe/boot Ortho Device/Splint Location: RLE Ortho Device/Splint Interventions: Application;Adjustment   Lesle Chris 02/22/2013, 5:22 PM

## 2013-02-22 NOTE — Progress Notes (Signed)
ANTIBIOTIC CONSULT NOTE - FOLLOW UP  Pharmacy Consult for vancomycin Indication: osteo  Labs:  Recent Labs  02/21/13 0710 02/22/13 0435  WBC 4.6 4.9  HGB 9.6* 8.7*  PLT 473* 423*  CREATININE 0.71 0.75   Estimated Creatinine Clearance: 97.3 ml/min (by C-G formula based on Cr of 0.75).  Recent Labs  02/22/13 0435  VANCOTROUGH 16.0      Assessment: 42yo female with therapeutic vancomycin trough though last dose was given late and lab was drawn early, making calculated trough of ~14 which is below goal.  Goal of Therapy:  Vancomycin trough level 15-20 mcg/ml  Plan:  Will increase vancomycin to 1500mg  IV Q12H for calculated trough of ~16 and continue to monitor.  Vernard Gambles, PharmD, BCPS  02/22/2013,5:36 AM

## 2013-02-22 NOTE — Progress Notes (Signed)
Subjective: 1 Day Post-Op Procedure(s) (LRB): IRRIGATION AND DEBRIDEMENT  RIGHT FOOT (Right) Patient reports pain as mild.    Objective: Vital signs in last 24 hours: Temp:  [97.2 F (36.2 C)-97.9 F (36.6 C)] 97.4 F (36.3 C) (09/12 1004) Pulse Rate:  [77-151] 81 (09/12 1004) Resp:  [17-24] 17 (09/12 1004) BP: (95-134)/(53-91) 95/54 mmHg (09/12 1004) SpO2:  [92 %-100 %] 96 % (09/12 1004) FiO2 (%):  [21 %] 21 % (09/12 0807)  Intake/Output from previous day: 09/11 0701 - 09/12 0700 In: 2065 [P.O.:540; I.V.:1525] Out: 1290 [Urine:1250; Blood:40] Intake/Output this shift:     Recent Labs  02/21/13 0710 02/22/13 0435  HGB 9.6* 8.7*    Recent Labs  02/21/13 0710 02/22/13 0435  WBC 4.6 4.9  RBC 3.23* 2.93*  HCT 29.7* 26.9*  PLT 473* 423*    Recent Labs  02/21/13 0710 02/22/13 0435  NA 137 137  K 4.2 3.9  CL 104 103  CO2 25 27  BUN 3* 4*  CREATININE 0.71 0.75  GLUCOSE 86 98  CALCIUM 8.8 8.5   No results found for this basename: LABPT, INR,  in the last 72 hours  PE:  incisions without purulence.  Mild erythema dorsally.  Drain in place.  Assessment/Plan: 1 Day Post-Op Procedure(s) (LRB): IRRIGATION AND DEBRIDEMENT  RIGHT FOOT (Right) Drain removed and dressing changed.  Daily dressing changes.  Pt is ok for d/c home at any time from my perspective.  WBATon R foot in hard shoe.  F/u with me in 2 weeks in clinic.  I'll sign off for the weekend and see her Monday if she's still here.  Toni Arthurs 02/22/2013, 1:22 PM

## 2013-02-22 NOTE — Progress Notes (Signed)
[facility] PROGRESS NOTE  Tracey Ward ZOX:[id number] DOB: November 24, 1970 DOA: 02/19/2013 PCP: [name], MD  Assessment/Plan: Principal Problem:   Osteomyelitis Active Problems:   Generalized anxiety disorder   Asthma, chronic    Assessment/Plan:  1. Right foot infection/Abscess: Patient presented with progressive swelling, pain and redness of her right foot, chills, and a borderline wcc. X-ray of the affected foot revealed possible gas and fluid collection along the dorsum of the midfoot. Managed initially with iv Vancomycin/Zosyn. MRI revealed large dorsal abscess of the foot overling the midfoot and measuring approximately 7 cm transverse by 1.6 cm craniocaudad by 3.6 cm proximal to distal, extending in a crescentic fashion in the dorsal subcutaneous tissues along the midfoot. There was extensive associated cellulitis. Dr Victorino Dike provided orthopedic consultation, and performed irrigation and excisional debridement of right foot dorsal abscesses including multiple areas, on 9/11/4. Wound culture grew MRSA, so Zosyn was discontinued on 02/21/13. Now on Vancomycin monotherapy, day #5. Managing per orthopedics.  2. Asthma: Stable/No evidence of exacerbation. Continued on Albuterol.  3. History of anxiety/ADD: Continue with Valium PRN, Ritalin.  4. Anemia: Patient had a mild normocytic anemia of 10.9 at presentation. HB has since drifted down, and is now 8.7, on POD #1, likely due to superimposed ABLA. Following HB.    Code Status: Full Code.  Family Communication:  Disposition Plan: To be determined.    Brief narrative: 42 y.o. female with Past medical history of asthma, depression, ADD, internal hemorrhoids, presenting with complaint of worsening of her right foot swelling that has been ongoing since last one week. While at her sister's house on 02/14/13, at around midnight, she noted right foot itching and warmth. Later on she also had some pain and she saw the PCP, who initially felt  it could be an allergic reaction, but later prescribed her Bactrim. After that she had at trip to [place]. She denies soaking her food in the sea water. She denies any seafood ingestion or bug bites. After that, since she was having significantly difficulty walking she presented to the [facility] [place] where she was given oral Keflex and oral Percocet. Despite that, she continued to have progressively worsening swelling and redness of the leg with some fever. Admitted for further management.    Consultants:  Dr Toni Arthurs, orthopedics.   Procedures:  X-Ray Right foot.  MRI right Foot.   Antibiotics:  Clindamycin 02/18/13.   Zosyn 02/18/13-02/21/13.  Vancomycin 02/18/13>>>  HPI/Subjective: No new issues today. Right foot pain is less.   Objective: Vital signs in last 24 hours: Temp:  [97.2 F (36.2 C)-98.6 F (37 C)] 97.4 F (36.3 C) (09/12 1004) Pulse Rate:  [77-151] 81 (09/12 1004) Resp:  [14-24] 17 (09/12 1004) BP: (95-134)/(53-91) 95/54 mmHg (09/12 1004) SpO2:  [92 %-100 %] 96 % (09/12 1004) FiO2 (%):  [21 %] 21 % (09/12 0807) Weight change:  Last BM Date: 02/20/13  Intake/Output from previous day: 09/11 0701 - 09/12 0700 In: 2065 [P.O.:540; I.V.:1525] Out: 1290 [Urine:1250; Blood:40]     Physical Exam: General: Comfortable, not short of breath at rest.  HEENT:  Mild clinical pallor, no jaundice, no conjunctival injection or discharge. NECK:  Supple, JVP not seen, no carotid bruits, no palpable lymphadenopathy, no palpable goiter. Hydration is fair.  CHEST:  Clinically clear to auscultation, no wheezes, few coarse rales. HEART:  Sounds 1 and 2 heard, normal, regular, no murmurs. ABDOMEN:  Moderately obese, soft, non-tender, no palpable organomegaly, no palpable masses, normal bowel sounds.  GENITALIA:  Not examined. LOWER EXTREMITIES:  No pitting edema, palpable peripheral pulses. MUSCULOSKELETAL SYSTEM:  Right ankle erythema has resolved. Right foot is under heavy  dressings. CENTRAL NERVOUS SYSTEM:  No focal neurologic deficit on gross examination.  Lab Results:  Recent Labs  02/21/13 0710 02/22/13 0435  WBC 4.6 4.9  HGB 9.6* 8.7*  HCT 29.7* 26.9*  PLT 473* 423*    Recent Labs  02/21/13 0710 02/22/13 0435  NA 137 137  K 4.2 3.9  CL 104 103  CO2 25 27  GLUCOSE 86 98  BUN 3* 4*  CREATININE 0.71 0.75  CALCIUM 8.8 8.5   Recent Results (from the past 240 hour(s))  WOUND CULTURE     Status: None   Collection Time    02/19/13  4:54 AM      Result Value Range Status   Specimen Description WOUND FOOT RIGHT   Final   Special Requests NONE   Final   Gram Stain     Final   Value: RARE WBC PRESENT,BOTH PMN AND MONONUCLEAR     NO SQUAMOUS EPITHELIAL CELLS SEEN     RARE GRAM POSITIVE COCCI IN CLUSTERS     Performed at [facility]   Culture     Final   Value: MODERATE METHICILLIN RESISTANT STAPHYLOCOCCUS AUREUS     Note: RIFAMPIN AND GENTAMICIN SHOULD NOT BE USED AS SINGLE DRUGS FOR TREATMENT OF STAPH INFECTIONS. This organism DOES NOT demonstrate inducible Clindamycin resistance in vitro. CRITICAL RESULT CALLED TO, READ BACK BY AND VERIFIED WITH: LIZE KOREDE@[email or web address]       ON [id number] BY [facility]     Performed at [facility]   Report Status 02/21/2013 FINAL   Final   Organism ID, Bacteria METHICILLIN RESISTANT STAPHYLOCOCCUS AUREUS   Final     Studies/Results: No results found.  Medications: Scheduled Meds: . albuterol  2.5 mg Nebulization BID  . fluticasone  2 puff Inhalation BID  . guaiFENesin  1,200 mg Oral BID  . methylphenidate  10 mg Oral BID  . pantoprazole  40 mg Oral BID AC  . polyethylene glycol  17 g Oral BID  . vancomycin  1,500 mg Intravenous Q12H  . Vilazodone HCl  20 mg Oral Daily   Continuous Infusions: . sodium chloride 100 mL/hr at 02/20/13 1500  . lactated ringers 50 mL/hr at 02/21/13 1651   PRN Meds:.acetaminophen, albuterol, bisacodyl, diazepam, diphenhydrAMINE, HYDROmorphone (DILAUDID)  injection, ondansetron (ZOFRAN) IV, ondansetron, oxyCODONE    LOS: 3 days   Jayjay Littles,CHRISTOPHER  [facility] Pager [phone]. If 8PM-8AM, please contact night-coverage at [email or web address], password [facility] 02/22/2013, 10:44 AM  LOS: 3 days

## 2013-02-23 LAB — CBC
MCHC: 32.7 g/dL (ref 30.0–36.0)
Platelets: 413 10*3/uL — ABNORMAL HIGH (ref 150–400)
RDW: 14.1 % (ref 11.5–15.5)
WBC: 5.1 10*3/uL (ref 4.0–10.5)

## 2013-02-23 LAB — BASIC METABOLIC PANEL
BUN: 3 mg/dL — ABNORMAL LOW (ref 6–23)
Chloride: 104 mEq/L (ref 96–112)
GFR calc Af Amer: >90 mL/min (ref >90)
GFR calc non Af Amer: >90 mL/min (ref >90)
Potassium: 3.7 mEq/L (ref 3.5–5.1)
Sodium: 138 mEq/L (ref 135–145)

## 2013-02-23 MED ORDER — LEVALBUTEROL HCL 0.63 MG/3ML IN NEBU
0.6300 mg | INHALATION_SOLUTION | RESPIRATORY_TRACT | Status: DC | PRN
  Administered 2013-02-23: 0.63 mg via RESPIRATORY_TRACT
  Filled 2013-02-23: qty 3

## 2013-02-23 NOTE — Progress Notes (Signed)
[facility] PROGRESS NOTE  Tracey Ward:[id number] DOB: 05-20-1971 DOA: 02/19/2013 PCP: [name], MD  Assessment/Plan: Principal Problem:   Osteomyelitis Active Problems:   Generalized anxiety disorder   Asthma, chronic   Anemia, unspecified    Assessment/Plan:  1. Right foot infection/Abscess: Patient presented with progressive swelling, pain and redness of her right foot, chills, and a borderline wcc. X-ray of the affected foot revealed possible gas and fluid collection along the dorsum of the midfoot. Managed initially with iv Vancomycin/Zosyn. MRI revealed large dorsal abscess of the foot overling the midfoot and measuring approximately 7 cm transverse by 1.6 cm craniocaudad by 3.6 cm proximal to distal, extending in a crescentic fashion in the dorsal subcutaneous tissues along the midfoot. There was extensive associated cellulitis. Dr Victorino Dike provided orthopedic consultation, and performed irrigation and excisional debridement of right foot dorsal abscesses including multiple areas, on 9/11/4. Wound culture grew MRSA, so Zosyn was discontinued on 02/21/13. Now on Vancomycin monotherapy, day #6. Managing per orthopedics.  2. Asthma: Stable/No evidence of exacerbation. Continued on bronchodilators.  3. History of anxiety/ADD: Continue with Valium PRN, Ritalin.  4. Anemia: Patient had a mild normocytic anemia of 10.9 at presentation. HB has since drifted down, and is now 8.7, on POD #1, likely due to superimposed ABLA. Following HB, and Hb is reasonable at 9.0 today.    Code Status: Full Code.  Family Communication:  Disposition Plan: To be determined.    Brief narrative: 42 y.o. female with Past medical history of asthma, depression, ADD, internal hemorrhoids, presenting with complaint of worsening of her right foot swelling that has been ongoing since last one week. While at her sister's house on 02/14/13, at around midnight, she noted right foot itching and warmth. Later  on she also had some pain and she saw the PCP, who initially felt it could be an allergic reaction, but later prescribed her Bactrim. After that she had at trip to [place]. She denies soaking her food in the sea water. She denies any seafood ingestion or bug bites. After that, since she was having significantly difficulty walking she presented to the [facility] where she was given oral Keflex and oral Percocet. Despite that, she continued to have progressively worsening swelling and redness of the leg with some fever. Admitted for further management.    Consultants:  Dr Toni Arthurs, orthopedics.   Procedures:  X-Ray Right foot.  MRI right Foot.   Antibiotics:  Clindamycin 02/18/13.   Zosyn 02/18/13-02/21/13.  Vancomycin 02/18/13>>>  HPI/Subjective: Right foot pain is less. Feels much better.   Objective: Vital signs in last 24 hours: Temp:  [98.2 F (36.8 C)-98.4 F (36.9 C)] 98.3 F (36.8 C) (09/13 0531) Pulse Rate:  [83-92] 83 (09/13 0531) Resp:  [16] 16 (09/13 0531) BP: (119-163)/(77-87) 119/77 mmHg (09/13 0531) SpO2:  [99 %-100 %] 99 % (09/13 0924) Weight change:  Last BM Date: 02/21/13  Intake/Output from previous day: 09/12 0701 - 09/13 0700 In: 1930 [P.O.:720; I.V.:1210] Out: -      Physical Exam: General: Comfortable, not short of breath at rest.  HEENT:  Mild clinical pallor, no jaundice, no conjunctival injection or discharge. NECK:  Supple, JVP not seen, no carotid bruits, no palpable lymphadenopathy, no palpable goiter. Hydration is fair.  CHEST:  Clinically clear to auscultation, no wheezes,  No crackles. HEART:  Sounds 1 and 2 heard, normal, regular, no murmurs. ABDOMEN:  Moderately obese, soft, non-tender, no palpable organomegaly, no palpable masses, normal bowel sounds.  GENITALIA:  Not examined. LOWER EXTREMITIES:  No pitting edema, palpable peripheral pulses. MUSCULOSKELETAL SYSTEM:  Right ankle erythema has resolved. Right foot is under heavy  dressings. CENTRAL NERVOUS SYSTEM:  No focal neurologic deficit on gross examination.  Lab Results:  Recent Labs  02/22/13 0435 02/23/13 0456  WBC 4.9 5.1  HGB 8.7* 9.0*  HCT 26.9* 27.5*  PLT 423* 413*    Recent Labs  02/22/13 0435 02/23/13 0456  NA 137 138  K 3.9 3.7  CL 103 104  CO2 27 26  GLUCOSE 98 85  BUN 4* 3*  CREATININE 0.75 0.65  CALCIUM 8.5 8.4   Recent Results (from the past 240 hour(s))  WOUND CULTURE     Status: None   Collection Time    02/19/13  4:54 AM      Result Value Range Status   Specimen Description WOUND FOOT RIGHT   Final   Special Requests NONE   Final   Gram Stain     Final   Value: RARE WBC PRESENT,BOTH PMN AND MONONUCLEAR     NO SQUAMOUS EPITHELIAL CELLS SEEN     RARE GRAM POSITIVE COCCI IN CLUSTERS     Performed at [facility]   Culture     Final   Value: MODERATE METHICILLIN RESISTANT STAPHYLOCOCCUS AUREUS     Note: RIFAMPIN AND GENTAMICIN SHOULD NOT BE USED AS SINGLE DRUGS FOR TREATMENT OF STAPH INFECTIONS. This organism DOES NOT demonstrate inducible Clindamycin resistance in vitro. CRITICAL RESULT CALLED TO, READ BACK BY AND VERIFIED WITH: LIZE KOREDE@1053       ON [id number] BY [facility]     Performed at [facility]   Report Status 02/21/2013 FINAL   Final   Organism ID, Bacteria METHICILLIN RESISTANT STAPHYLOCOCCUS AUREUS   Final     Studies/Results: No results found.  Medications: Scheduled Meds: . fluticasone  2 puff Inhalation BID  . guaiFENesin  1,200 mg Oral BID  . methylphenidate  10 mg Oral BID  . oxyCODONE  5 mg Oral QID  . pantoprazole  40 mg Oral BID AC  . polyethylene glycol  17 g Oral BID  . vancomycin  1,500 mg Intravenous Q12H  . Vilazodone HCl  20 mg Oral Daily   Continuous Infusions: . lactated ringers 50 mL/hr at 02/21/13 1651   PRN Meds:.acetaminophen, albuterol, bisacodyl, diazepam, diphenhydrAMINE, HYDROmorphone (DILAUDID) injection, ondansetron (ZOFRAN) IV, ondansetron    LOS: 4 days    Belia Febo,CHRISTOPHER  [facility] Pager [phone]. If 8PM-8AM, please contact night-coverage at [email or web address], password [facility] 02/23/2013, 1:40 PM  LOS: 4 days

## 2013-02-23 NOTE — Progress Notes (Signed)
Orthopedics Progress Note  Subjective: I am stopping my dilaudid so I can go home on Monday  Objective:  Filed Vitals:   02/23/13 0531  BP: 119/77  Pulse: 83  Temp: 98.3 F (36.8 C)  Resp: 16    General: Awake and alert  Musculoskeletal: right foot wound dressing changed, mod edema and erythema, no blisters or active drainage, wiggles toes Neurovascularly intact  Lab Results  Component Value Date   WBC 5.1 02/23/2013   HGB 9.0* 02/23/2013   HCT 27.5* 02/23/2013   MCV 92.6 02/23/2013   PLT 413* 02/23/2013       Component Value Date/Time   NA 138 02/23/2013 0456   K 3.7 02/23/2013 0456   CL 104 02/23/2013 0456   CO2 26 02/23/2013 0456   GLUCOSE 85 02/23/2013 0456   BUN 3* 02/23/2013 0456   CREATININE 0.65 02/23/2013 0456   CALCIUM 8.4 02/23/2013 0456   GFRNONAA >90 02/23/2013 0456   GFRAA >90 02/23/2013 0456    Lab Results  Component Value Date   INR 1.14 02/19/2013    Assessment/Plan:  s/p Procedure(s): IRRIGATION AND DEBRIDEMENT  RIGHT FOOT Continue daily dry dressing changes and antibiotics Supportive care and pain management  Viviann Spare R. Ranell Patrick, MD 02/23/2013 12:14 PM

## 2013-02-24 DIAGNOSIS — L02419 Cutaneous abscess of limb, unspecified: Secondary | ICD-10-CM | POA: Diagnosis present

## 2013-02-24 LAB — BASIC METABOLIC PANEL
BUN: 8 mg/dL (ref 6–23)
Chloride: 106 mEq/L (ref 96–112)
GFR calc Af Amer: >90 mL/min (ref >90)
GFR calc non Af Amer: >90 mL/min (ref >90)
Glucose, Bld: 100 mg/dL — ABNORMAL HIGH (ref 70–99)
Potassium: 3.7 mEq/L (ref 3.5–5.1)
Sodium: 140 mEq/L (ref 135–145)

## 2013-02-24 LAB — CBC
HCT: 28 % — ABNORMAL LOW (ref 36.0–46.0)
Hemoglobin: 9.2 g/dL — ABNORMAL LOW (ref 12.0–15.0)
MCHC: 32.9 g/dL (ref 30.0–36.0)
WBC: 6.4 10*3/uL (ref 4.0–10.5)

## 2013-02-24 MED ORDER — OXYCODONE HCL 5 MG PO TABS
10.0000 mg | ORAL_TABLET | Freq: Once | ORAL | Status: AC
  Administered 2013-02-24: 10 mg via ORAL
  Filled 2013-02-24: qty 2

## 2013-02-24 NOTE — Progress Notes (Signed)
[facility] PROGRESS NOTE  Tracey Ward ZOX:[id number] DOB: 22-Jun-1970 DOA: 02/19/2013 PCP: [name], MD  Assessment/Plan: Principal Problem:   Osteomyelitis Active Problems:   Generalized anxiety disorder   Asthma, chronic   Anemia, unspecified    Assessment/Plan:  1. Right foot infection/Abscess: Patient presented with progressive swelling, pain and redness of her right foot, chills, and a borderline wcc. X-ray of the affected foot revealed possible gas and fluid collection along the dorsum of the midfoot. Managed initially with iv Vancomycin/Zosyn. MRI revealed large dorsal abscess of the foot overling the midfoot and measuring approximately 7 cm transverse by 1.6 cm craniocaudad by 3.6 cm proximal to distal, extending in a crescentic fashion in the dorsal subcutaneous tissues along the midfoot. There was extensive associated cellulitis. Dr Victorino Dike provided orthopedic consultation, and performed irrigation and excisional debridement of right foot dorsal abscesses including multiple areas, on 9/11/4. Wound culture grew MRSA, so Zosyn was discontinued on 02/21/13. Now on Vancomycin monotherapy, day #7. Managing per orthopedics.  2. Asthma: Stable/No evidence of exacerbation. Continued on bronchodilators.  3. History of anxiety/ADD: Continue with Valium PRN, Ritalin.  4. Anemia: Patient had a mild normocytic anemia of 10.9 at presentation. HB has since drifted down, and is now 8.7, on POD #1, likely due to superimposed ABLA. Following HB, and this is now reasonable/stable.    Code Status: Full Code.  Family Communication:  Disposition Plan: To be determined. Per ortho.    Brief narrative: 42 y.o. female with Past medical history of asthma, depression, ADD, internal hemorrhoids, presenting with complaint of worsening of her right foot swelling that has been ongoing since last one week. While at her sister's house on 02/14/13, at around midnight, she noted right foot itching and  warmth. Later on she also had some pain and she saw the PCP, who initially felt it could be an allergic reaction, but later prescribed her Bactrim. After that she had at trip to [place]. She denies soaking her food in the sea water. She denies any seafood ingestion or bug bites. After that, since she was having significantly difficulty walking she presented to the [facility] [place] where she was given oral Keflex and oral Percocet. Despite that, she continued to have progressively worsening swelling and redness of the leg with some fever. Admitted for further management.    Consultants:  Dr Toni Arthurs, orthopedics.   Procedures:  X-Ray Right foot.  MRI right Foot.   Antibiotics:  Clindamycin 02/18/13.   Zosyn 02/18/13-02/21/13.  Vancomycin 02/18/13>>>  HPI/Subjective: Feels much better. No new issues.   Objective: Vital signs in last 24 hours: Temp:  [98 F (36.7 C)-98.1 F (36.7 C)] 98 F (36.7 C) (09/14 0529) Pulse Rate:  [74-88] 88 (09/14 0529) Resp:  [17-18] 18 (09/14 0529) BP: (124-139)/(81-88) 139/88 mmHg (09/14 0529) SpO2:  [97 %-100 %] 97 % (09/14 0909) FiO2 (%):  [21 %] 21 % (09/14 0909) Weight change:  Last BM Date: 02/23/13  Intake/Output from previous day: 09/13 0701 - 09/14 0700 In: 1000 [IV Piggyback:1000] Out: -      Physical Exam: General: Comfortable, not short of breath at rest.  HEENT:  Mild clinical pallor, no jaundice, no conjunctival injection or discharge. NECK:  Supple, JVP not seen, no carotid bruits, no palpable lymphadenopathy, no palpable goiter. Hydration is fair.  CHEST:  Clinically clear to auscultation, no wheezes,  No crackles. HEART:  Sounds 1 and 2 heard, normal, regular, no murmurs. ABDOMEN:  Moderately obese, soft, non-tender, no palpable  organomegaly, no palpable masses, normal bowel sounds. GENITALIA:  Not examined. LOWER EXTREMITIES:  No pitting edema, palpable peripheral pulses. MUSCULOSKELETAL SYSTEM:  Right ankle erythema has  resolved. Right foot is under heavy dressings. CENTRAL NERVOUS SYSTEM:  No focal neurologic deficit on gross examination.  Lab Results:  Recent Labs  02/23/13 0456 02/24/13 0500  WBC 5.1 6.4  HGB 9.0* 9.2*  HCT 27.5* 28.0*  PLT 413* 405*    Recent Labs  02/23/13 0456 02/24/13 0500  NA 138 140  K 3.7 3.7  CL 104 106  CO2 26 25  GLUCOSE 85 100*  BUN 3* 8  CREATININE 0.65 0.68  CALCIUM 8.4 8.7   Recent Results (from the past 240 hour(s))  WOUND CULTURE     Status: None   Collection Time    02/19/13  4:54 AM      Result Value Range Status   Specimen Description WOUND FOOT RIGHT   Final   Special Requests NONE   Final   Gram Stain     Final   Value: RARE WBC PRESENT,BOTH PMN AND MONONUCLEAR     NO SQUAMOUS EPITHELIAL CELLS SEEN     RARE GRAM POSITIVE COCCI IN CLUSTERS     Performed at [facility]   Culture     Final   Value: MODERATE METHICILLIN RESISTANT STAPHYLOCOCCUS AUREUS     Note: RIFAMPIN AND GENTAMICIN SHOULD NOT BE USED AS SINGLE DRUGS FOR TREATMENT OF STAPH INFECTIONS. This organism DOES NOT demonstrate inducible Clindamycin resistance in vitro. CRITICAL RESULT CALLED TO, READ BACK BY AND VERIFIED WITH: LIZE KOREDE@1053       ON [id number] BY [facility]     Performed at [facility]   Report Status 02/21/2013 FINAL   Final   Organism ID, Bacteria METHICILLIN RESISTANT STAPHYLOCOCCUS AUREUS   Final     Studies/Results: No results found.  Medications: Scheduled Meds: . fluticasone  2 puff Inhalation BID  . guaiFENesin  1,200 mg Oral BID  . methylphenidate  10 mg Oral BID  . oxyCODONE  5 mg Oral QID  . pantoprazole  40 mg Oral BID AC  . polyethylene glycol  17 g Oral BID  . vancomycin  1,500 mg Intravenous Q12H  . Vilazodone HCl  20 mg Oral Daily   Continuous Infusions: . lactated ringers 50 mL/hr at 02/21/13 1651   PRN Meds:.acetaminophen, bisacodyl, diazepam, diphenhydrAMINE, HYDROmorphone (DILAUDID) injection, levalbuterol, ondansetron  (ZOFRAN) IV, ondansetron    LOS: 5 days   Baylee Campus,CHRISTOPHER  [facility] Pager [phone]. If 8PM-8AM, please contact night-coverage at [email or web address], password [facility] 02/24/2013, 3:23 PM  LOS: 5 days

## 2013-02-25 LAB — CBC
HCT: 28.5 % — ABNORMAL LOW (ref 36.0–46.0)
MCH: 30 pg (ref 26.0–34.0)
MCHC: 33 g/dL (ref 30.0–36.0)
MCV: 91.1 fL (ref 78.0–100.0)
RDW: 14 % (ref 11.5–15.5)

## 2013-02-25 LAB — BASIC METABOLIC PANEL
BUN: 5 mg/dL — ABNORMAL LOW (ref 6–23)
Calcium: 8.6 mg/dL (ref 8.4–10.5)
Creatinine, Ser: 0.72 mg/dL (ref 0.50–1.10)
GFR calc Af Amer: >90 mL/min (ref >90)
GFR calc non Af Amer: >90 mL/min (ref >90)
Potassium: 3.4 mEq/L — ABNORMAL LOW (ref 3.5–5.1)

## 2013-02-25 MED ORDER — DOXYCYCLINE HYCLATE 50 MG PO CAPS: 100.0000 mg | ORAL_CAPSULE | Freq: Two times a day (BID) | ORAL | Status: AC

## 2013-02-25 MED ORDER — OXYCODONE HCL 5 MG PO TABS: 5.0000 mg | ORAL_TABLET | ORAL | Status: AC | PRN

## 2013-02-25 MED ORDER — POTASSIUM CHLORIDE CRYS ER 20 MEQ PO TBCR
40.0000 meq | EXTENDED_RELEASE_TABLET | Freq: Once | ORAL | Status: AC
  Administered 2013-02-25: 40 meq via ORAL
  Filled 2013-02-25: qty 2

## 2013-02-25 NOTE — Progress Notes (Signed)
Subjective: 4 Days Post-Op Procedure(s) (LRB): IRRIGATION AND DEBRIDEMENT  RIGHT FOOT (Right) Patient reports pain as mild.  On IV vanc.  Objective: Vital signs in last 24 hours: Temp:  [98.2 F (36.8 C)-98.6 F (37 C)] 98.6 F (37 C) (09/15 0552) Pulse Rate:  [76-83] 76 (09/15 0552) Resp:  [16-19] 18 (09/15 0552) BP: (116-129)/(66-75) 116/66 mmHg (09/15 0552) SpO2:  [96 %-98 %] 98 % (09/15 0726) FiO2 (%):  [21 %] 21 % (09/14 0909)  Intake/Output from previous day:   Intake/Output this shift:     Recent Labs  02/23/13 0456 02/24/13 0500 02/25/13 0515  HGB 9.0* 9.2* 9.4*    Recent Labs  02/24/13 0500 02/25/13 0515  WBC 6.4 6.7  RBC 3.05* 3.13*  HCT 28.0* 28.5*  PLT 405* 363    Recent Labs  02/24/13 0500 02/25/13 0515  NA 140 142  K 3.7 3.4*  CL 106 105  CO2 25 27  BUN 8 5*  CREATININE 0.68 0.72  GLUCOSE 100* 106*  CALCIUM 8.7 8.6   No results found for this basename: LABPT, INR,  in the last 72 hours  PE:  right dorsal foot wound with decreased swelling, drainage compared to friday.  NVI.  No erythema.  Minimal swelling.  Assessment/Plan: 4 Days Post-Op Procedure(s) (LRB): IRRIGATION AND DEBRIDEMENT  RIGHT FOOT (Right) D/c home at any time.  IV abx per primary team or consider switch to orals.  F/u with me in 2 weeks in clinic.  Continue dry dressing change daily.  wbat in hard sole shoe.  D/c info filled out in epic.  I'll sign off now.  Toni Arthurs 02/25/2013, 7:46 AM

## 2013-02-25 NOTE — Discharge Summary (Signed)
Physician Discharge Summary  Tracey Ward:[id number] DOB: 01-25-1971 DOA: 02/19/2013  PCP: [name], MD  Admit date: 02/19/2013 Discharge date: 02/25/2013  Time spent: 40 minutes  Recommendations for Outpatient Follow-up:  1. Follow up with primary MD.  2. Follow up with Dr Toni Arthurs, orthopedic surgeon, in 2 weeks. 3. Dry wound dressings, daily.   Discharge Diagnoses:  Principal Problem:   Osteomyelitis Active Problems:   Generalized anxiety disorder   Asthma, chronic   Anemia, unspecified   Cellulitis and abscess of leg   Discharge Condition: Satisfactory.   Diet recommendation: Regular.   Filed Weights   02/18/13 2344 02/19/13 0417  Weight: 72.576 kg (160 lb) 91.354 kg (201 lb 6.4 oz)    History of present illness:  42 y.o. female with Past medical history of asthma, depression, ADD, internal hemorrhoids, presenting with complaint of worsening of her right foot swelling that has been ongoing since last one week. While at her sister's house on 02/14/13, at around midnight, she noted right foot itching and warmth. Later on she also had some pain and she saw the PCP, who initially felt it could be an allergic reaction, but later prescribed her Bactrim. After that she had at trip to [place]. She denies soaking her food in the sea water. She denies any seafood ingestion or bug bites. After that, since she was having significantly difficulty walking she presented to the [facility] where she was given oral Keflex and oral Percocet. Despite that, she continued to have progressively worsening swelling and redness of the leg with some fever. Admitted for further management.    Hospital Course:  1. Right foot infection/Abscess: Patient presented with progressive swelling, pain and redness of her right foot, chills, and a borderline wcc. X-ray of the affected foot revealed possible gas and fluid collection along the dorsum of the midfoot. Managed initially with iv Vancomycin/Zosyn.  MRI revealed large dorsal abscess of the foot overlying the midfoot and measuring approximately 7 cm transverse by 1.6 cm craniocaudad by 3.6 cm proximal to distal, extending in a crescentic fashion in the dorsal subcutaneous tissues along the midfoot. There was extensive associated cellulitis. Dr Victorino Dike provided orthopedic consultation, and performed irrigation and excisional debridement of right foot dorsal abscesses including multiple areas, on 9/11/4. Wound culture grew MRSA, so Zosyn was discontinued on 02/21/13. Vancomycin monotherapy, was continued till AM of 02/25/13, then patient was transitioned to oral Doxycycline, for a further 2 weeks, to be concluded on 03/11/13.  2. Asthma: Stable/No evidence of exacerbation. Managed with bronchodilators, during this hospitalization.  3. History of anxiety/ADD: Stable on Valium PRN, Ritalin.  4. Anemia: Patient had a mild normocytic anemia of 10.9 at presentation. HB has since drifted down, and was 8.7, on POD #1, likely due to superimposed ABLA. Since then, HB remained reasonable/stable, and was 9.4 on 02/25/13.    Procedures:  See Below.   Consultations: Dr Toni Arthurs, orthopedics.    Discharge Exam: Filed Vitals:   02/25/13 0552  BP: 116/66  Pulse: 76  Temp: 98.6 F (37 C)  Resp: 18    General: Comfortable, not short of breath at rest.  HEENT: Mild clinical pallor, no jaundice, no conjunctival injection or discharge.  NECK: Supple, JVP not seen, no carotid bruits, no palpable lymphadenopathy, no palpable goiter. Hydration is fair.  CHEST: Clinically clear to auscultation, no wheezes, No crackles.  HEART: Sounds 1 and 2 heard, normal, regular, no murmurs.  ABDOMEN: Moderately obese, soft, non-tender, no palpable organomegaly,  no palpable masses, normal bowel sounds.  GENITALIA: Not examined.  LOWER EXTREMITIES: No pitting edema, palpable peripheral pulses.  MUSCULOSKELETAL SYSTEM: Right ankle erythema has resolved. Right foot is under  heavy dressings.  CENTRAL NERVOUS SYSTEM: No focal neurologic deficit on gross examination.   Discharge Instructions      Discharge Orders   Future Orders Complete By Expires   Diet general  As directed    Increase activity slowly  As directed        Medication List    STOP taking these medications       cephALEXin 500 MG capsule  Commonly known as:  KEFLEX     fluconazole 150 MG tablet  Commonly known as:  DIFLUCAN     oxyCODONE-acetaminophen 5-325 MG per tablet  Commonly known as:  PERCOCET/ROXICET     PROAIR HFA 108 (90 BASE) MCG/ACT inhaler  Generic drug:  albuterol     sulfamethoxazole-trimethoprim 800-160 MG per tablet  Commonly known as:  SEPTRA DS      TAKE these medications       benzonatate 200 MG capsule  Commonly known as:  TESSALON  Take 1 capsule (200 mg total) by mouth 3 (three) times daily.     cetirizine 10 MG tablet  Commonly known as:  ZYRTEC  Take 10 mg by mouth daily.     diazepam 10 MG tablet  Commonly known as:  VALIUM  Take 10 mg by mouth every 6 (six) hours as needed for anxiety.     diphenhydrAMINE 25 MG tablet  Commonly known as:  BENADRYL  Take 25 mg by mouth 2 (two) times daily as needed (congestion).     doxycycline 50 MG capsule  Commonly known as:  VIBRAMYCIN  Take 2 capsules (100 mg total) by mouth 2 (two) times daily.     fluticasone 44 MCG/ACT inhaler  Commonly known as:  FLOVENT HFA  Inhale 2 puffs into the lungs 2 (two) times daily.     guaiFENesin 600 MG 12 hr tablet  Commonly known as:  MUCINEX  Take 1,200 mg by mouth 2 (two) times daily as needed for congestion.     ibuprofen 200 MG tablet  Commonly known as:  ADVIL,MOTRIN  Take 800 mg by mouth 3 (three) times daily as needed for pain.     levalbuterol 1.25 MG/0.5ML nebulizer solution  Commonly known as:  XOPENEX  Take 0.63 mg by nebulization every 3 (three) hours as needed for wheezing or shortness of breath.     levalbuterol 45 MCG/ACT inhaler   Commonly known as:  XOPENEX HFA  Inhale 1-2 puffs into the lungs every 6 (six) hours as needed for wheezing or shortness of breath.     methylphenidate 20 MG tablet  Commonly known as:  RITALIN  Take 20 mg by mouth daily.     ondansetron 4 MG disintegrating tablet  Commonly known as:  ZOFRAN ODT  Take 1 tablet (4 mg total) by mouth every 8 (eight) hours as needed for nausea.     oxyCODONE 5 MG immediate release tablet  Commonly known as:  Oxy IR/ROXICODONE  Take 1 tablet (5 mg total) by mouth every 4 (four) hours as needed for pain.     pantoprazole 40 MG tablet  Commonly known as:  PROTONIX  Take 1 tablet (40 mg total) by mouth 2 (two) times daily before a meal.     VIIBRYD 20 MG Tabs  Generic drug:  Vilazodone HCl  Take 20 mg  by mouth daily.       No Known Allergies Follow-up Information   Follow up with HEWITT, Jonny Ruiz, MD. Schedule an appointment as soon as possible for a visit in 2 weeks.   Specialty:  Orthopedic Surgery   Contact information:   [address] [place] [address] [phone]       Schedule an appointment as soon as possible for a visit with [name], MD.   Specialty:  Family Medicine   Contact information:   [address] [place] [address] [phone]        The results of significant diagnostics from this hospitalization (including imaging, microbiology, ancillary and laboratory) are listed below for reference.    Significant Diagnostic Studies: Mr Foot Right W Wo Contrast  02/20/2013   CLINICAL DATA:  Abscess of right dorsal foot. Swelling. Erythema. Fever.  EXAM: MRI OF THE RIGHT FOREFOOT WITHOUT AND WITH CONTRAST  TECHNIQUE: Multiplanar, multisequence MR imaging was performed both before and after administration of intravenous contrast.  CONTRAST:  19mL MULTIHANCE GADOBENATE DIMEGLUMINE 529 MG/ML IV SOLN  COMPARISON:  02/19/2013  FINDINGS: Dysplastic or postoperative findings noted involving the  phalanges. Fifth metatarsal osteotomy noted with a small amount of metal artifact. Metal artifact from prior operative intervention also noted in the great toe.  Survey imaging of the entire foot was performed. Although this has a larger field of view which restricts visualization of smaller structures such as ligaments, it allows for assessment of the of the forefoot and hindfoot.  Large dorsal abscess of the foot overlies the midfoot and measures approximately 7 cm transverse by 1.6 cm craniocaudad by 3.6 cm proximal to distal. It extends in a crescentic fashion in the dorsal subcutaneous tissues along the midfoot.  Extensive subcutaneous edema and enhancement noted in the foot compatible with cellulitis. No obvious osteomyelitis.  There is tibialis posterior Tina synovitis shown on image 15 of series 5. There is mild common peroneus tendon sheath tenosynovitis and in effusion of the posterior subtalar joint. Small tibiotalar joint effusion noted.  IMPRESSION: 1. Abscess in the subcutaneous tissues dorsal to the midfoot with measurements as above. Extensive surrounding the cellulitis in the foot. No osteomyelitis identified. 2. Postoperative and potentially dysplastic findings involving the phalanges.   Electronically Signed   By: Herbie Baltimore   On: 02/20/2013 09:09   Dg Chest Port 1 View  01/29/2013   *RADIOLOGY REPORT*  Clinical Data: Shortness of breath.  Congestion.  Coughing. History of asthma.  History of smoking.  PORTABLE CHEST - 1 VIEW  Comparison: 01/25/2013.  07/21/2008.  Findings: Cardiac silhouette is normal size and shape.  Mediastinal and hilar contours appear stable without evidence of enlargement. No pulmonary infiltrates or masses were evident.  There is a small area of subsegmental atelectasis or fibrosis in the right lung base. No pleural abnormality is evident. Bones appear average for age.  IMPRESSION: No pulmonary edema, pneumonia, or pleural effusion.  Small area of subsegmental  atelectasis or fibrosis in right base.   Original Report Authenticated By: Onalee Hua Call   Dg Foot Complete Right  02/19/2013   *RADIOLOGY REPORT*  Clinical Data: Foot pain.  Rule out osteomyelitis.  RIGHT FOOT COMPLETE - 3+ VIEW  Comparison: None.  Findings: There is marked soft tissue swelling along the dorsum of the foot.  There is a gas and fluid dorsal to the midfoot tarsal bones.  Possible fluid collection measures 38 x 4.3 cm.  No underlying bony erosion  identified.  Postoperative changes involving the proximal phalanx and distal aspect of the fifth metatarsal.  IMPRESSION:  1.  There is a out possible gas and fluid collection along the dorsum of the midfoot.  Advise further evaluation with cross- sectional imaging.  The study of choice would be a contrast- enhanced MRI or contrast enhanced CT. 2.  No focal bony erosions identified.   Original Report Authenticated By: Signa Kell, M.D.    Microbiology: Recent Results (from the past 240 hour(s))  WOUND CULTURE     Status: None   Collection Time    02/19/13  4:54 AM      Result Value Range Status   Specimen Description WOUND FOOT RIGHT   Final   Special Requests NONE   Final   Gram Stain     Final   Value: RARE WBC PRESENT,BOTH PMN AND MONONUCLEAR     NO SQUAMOUS EPITHELIAL CELLS SEEN     RARE GRAM POSITIVE COCCI IN CLUSTERS     Performed at [facility]   Culture     Final   Value: MODERATE METHICILLIN RESISTANT STAPHYLOCOCCUS AUREUS     Note: RIFAMPIN AND GENTAMICIN SHOULD NOT BE USED AS SINGLE DRUGS FOR TREATMENT OF STAPH INFECTIONS. This organism DOES NOT demonstrate inducible Clindamycin resistance in vitro. CRITICAL RESULT CALLED TO, READ BACK BY AND VERIFIED WITH: LIZE [email or web address]       ON [id number] BY [facility]     Performed at [facility]   Report Status 02/21/2013 FINAL   Final   Organism ID, Bacteria METHICILLIN RESISTANT STAPHYLOCOCCUS AUREUS   Final     Labs: Basic Metabolic Panel:  Recent Labs Lab  02/21/13 0710 02/22/13 0435 02/23/13 0456 02/24/13 0500 02/25/13 0515  NA 137 137 138 140 142  K 4.2 3.9 3.7 3.7 3.4*  CL 104 103 104 106 105  CO2 25 27 26 25 27   GLUCOSE 86 98 85 100* 106*  BUN 3* 4* 3* 8 5*  CREATININE 0.71 0.75 0.65 0.68 0.72  CALCIUM 8.8 8.5 8.4 8.7 8.6   Liver Function Tests:  Recent Labs Lab 02/19/13 0146 02/19/13 0500  AST 26 30  ALT 34 34  ALKPHOS 100 97  BILITOT <0.1* 0.1*  PROT 6.3 6.0  ALBUMIN 2.5* 2.5*   No results found for this basename: LIPASE, AMYLASE,  in the last 168 hours No results found for this basename: AMMONIA,  in the last 168 hours CBC:  Recent Labs Lab 02/21/13 0710 02/22/13 0435 02/23/13 0456 02/24/13 0500 02/25/13 0515  WBC 4.6 4.9 5.1 6.4 6.7  HGB 9.6* 8.7* 9.0* 9.2* 9.4*  HCT 29.7* 26.9* 27.5* 28.0* 28.5*  MCV 92.0 91.8 92.6 91.8 91.1  PLT 473* 423* 413* 405* 363   Cardiac Enzymes: No results found for this basename: CKTOTAL, CKMB, CKMBINDEX, TROPONINI,  in the last 168 hours BNP: BNP (last 3 results) No results found for this basename: PROBNP,  in the last 8760 hours CBG:  Recent Labs Lab 02/21/13 0757 02/22/13 0808 02/23/13 0736 02/24/13 0732 02/25/13 0759  GLUCAP 82 107* 92 96 85       Signed:  Laronica Bhagat,CHRISTOPHER  [facility] 02/25/2013, 11:12 AM

## 2013-02-25 NOTE — Progress Notes (Signed)
ANTIBIOTIC CONSULT NOTE - FOLLOW UP  Pharmacy Consult for Vancomycin Indication: R foot infection/abscess  No Known Allergies  Patient Measurements: Height: 5\' 2"  (157.5 cm) Weight: 201 lb 6.4 oz (91.354 kg) IBW/kg (Calculated) : 50.1  Vital Signs: Temp: 98.6 F (37 C) (09/15 0552) Temp src: Oral (09/14 2131) BP: 116/66 mmHg (09/15 0552) Pulse Rate: 76 (09/15 0552) Intake/Output from previous day:   Intake/Output from this shift:    Labs:  Recent Labs  02/23/13 0456 02/24/13 0500 02/25/13 0515  WBC 5.1 6.4 6.7  HGB 9.0* 9.2* 9.4*  PLT 413* 405* 363  CREATININE 0.65 0.68 0.72   Estimated Creatinine Clearance: 97.3 ml/min (by C-G formula based on Cr of 0.72). No results found for this basename: VANCOTROUGH, VANCOPEAK, VANCORANDOM, GENTTROUGH, GENTPEAK, GENTRANDOM, TOBRATROUGH, TOBRAPEAK, TOBRARND, AMIKACINPEAK, AMIKACINTROU, AMIKACIN,  in the last 72 hours   Microbiology: Recent Results (from the past 720 hour(s))  WOUND CULTURE     Status: None   Collection Time    02/19/13  4:54 AM      Result Value Range Status   Specimen Description WOUND FOOT RIGHT   Final   Special Requests NONE   Final   Gram Stain     Final   Value: RARE WBC PRESENT,BOTH PMN AND MONONUCLEAR     NO SQUAMOUS EPITHELIAL CELLS SEEN     RARE GRAM POSITIVE COCCI IN CLUSTERS     Performed at [facility]   Culture     Final   Value: MODERATE METHICILLIN RESISTANT STAPHYLOCOCCUS AUREUS     Note: RIFAMPIN AND GENTAMICIN SHOULD NOT BE USED AS SINGLE DRUGS FOR TREATMENT OF STAPH INFECTIONS. This organism DOES NOT demonstrate inducible Clindamycin resistance in vitro. CRITICAL RESULT CALLED TO, READ BACK BY AND VERIFIED WITH: LIZE [email or web address]@1053       ON [id number] BY [facility]     Performed at [facility]   Report Status 02/21/2013 FINAL   Final   Organism ID, Bacteria METHICILLIN RESISTANT STAPHYLOCOCCUS AUREUS   Final    Anti-infectives   Start     Dose/Rate Route Frequency Ordered Stop    02/22/13 0600  vancomycin (VANCOCIN) 1,500 mg in sodium chloride 0.9 % 500 mL IVPB     1,500 mg 250 mL/hr over 120 Minutes Intravenous Every 12 hours 02/22/13 0535     02/19/13 0600  piperacillin-tazobactam (ZOSYN) IVPB 3.375 g  Status:  Discontinued     3.375 g 12.5 mL/hr over 240 Minutes Intravenous 3 times per day 02/19/13 0448 02/21/13 1128   02/19/13 0600  vancomycin (VANCOCIN) 1,250 mg in sodium chloride 0.9 % 250 mL IVPB  Status:  Discontinued     1,250 mg 166.7 mL/hr over 90 Minutes Intravenous Every 12 hours 02/19/13 0448 02/22/13 0535   02/19/13 0145  clindamycin (CLEOCIN) IVPB 900 mg     900 mg 100 mL/hr over 30 Minutes Intravenous  Once 02/19/13 0130 02/19/13 0238      Assessment: Pt on Vancomycin Day #7 for right foot MRSA cellulitis with abscess. s/p I&D on 9/11. MRI negative for osteo. Failed outpt Septra and Keflex. Afeb. WBC WNL.  Vanc 9/9 >> Zosyn 9/9 >> 9/11  9/12 VT = 16 mcg/mL (last dose given late, lab drawn early - 1250mg  q12, SCr 0.75)  9/9 wound cx - MRSA (Vanc MIC 1)  Goal of Therapy:  Vancomycin trough level 15-20 mcg/ml  Plan:  1) Vanc 1500mg  IV Q12H.  2) Vanc trough tomorrow a.m. to verify therapeutic 3) Monitor renal  fxn, culture data, clinical course  Christoper Fabian, PharmD, BCPS Clinical pharmacist, pager [phone] 02/25/2013,7:34 AM

## 2013-02-25 NOTE — Progress Notes (Signed)
Patient discharged to home with husband at 57 with all belongings . Right foot dressing changed by MD this am . Dressing clean dry and intact. Discharge instructions given and reviewed by Delsa Grana. Patient and husband verbalized understanding of discharge instructions . Patient and husband verbalized understanding of changing right foot dressing . Continue with plan of care .                  Cleotilde Neer

## 2015-07-13 ENCOUNTER — Emergency Department (HOSPITAL_BASED_OUTPATIENT_CLINIC_OR_DEPARTMENT_OTHER)
Admission: EM | Admit: 2015-07-13 | Discharge: 2015-07-13 | Disposition: A | Discharge status: Home / Self Care | Payer: BLUE CROSS/BLUE SHIELD | Attending: Emergency Medicine | Admitting: Emergency Medicine

## 2015-07-13 ENCOUNTER — Encounter (HOSPITAL_BASED_OUTPATIENT_CLINIC_OR_DEPARTMENT_OTHER): Payer: Self-pay | Admitting: *Deleted

## 2015-07-13 DIAGNOSIS — Y9389 Activity, other specified: Secondary | ICD-10-CM | POA: Insufficient documentation

## 2015-07-13 DIAGNOSIS — F329 Major depressive disorder, single episode, unspecified: Secondary | ICD-10-CM | POA: Insufficient documentation

## 2015-07-13 DIAGNOSIS — X58XXXA Exposure to other specified factors, initial encounter: Secondary | ICD-10-CM | POA: Insufficient documentation

## 2015-07-13 DIAGNOSIS — T7840XA Allergy, unspecified, initial encounter: Secondary | ICD-10-CM

## 2015-07-13 DIAGNOSIS — T781XXA Other adverse food reactions, not elsewhere classified, initial encounter: Secondary | ICD-10-CM | POA: Insufficient documentation

## 2015-07-13 DIAGNOSIS — J45909 Unspecified asthma, uncomplicated: Secondary | ICD-10-CM | POA: Diagnosis not present

## 2015-07-13 DIAGNOSIS — F909 Attention-deficit hyperactivity disorder, unspecified type: Secondary | ICD-10-CM | POA: Insufficient documentation

## 2015-07-13 DIAGNOSIS — L500 Allergic urticaria: Secondary | ICD-10-CM | POA: Insufficient documentation

## 2015-07-13 DIAGNOSIS — Y9289 Other specified places as the place of occurrence of the external cause: Secondary | ICD-10-CM | POA: Diagnosis not present

## 2015-07-13 DIAGNOSIS — R220 Localized swelling, mass and lump, head: Secondary | ICD-10-CM | POA: Insufficient documentation

## 2015-07-13 DIAGNOSIS — Z7951 Long term (current) use of inhaled steroids: Secondary | ICD-10-CM | POA: Insufficient documentation

## 2015-07-13 DIAGNOSIS — Z79899 Other long term (current) drug therapy: Secondary | ICD-10-CM | POA: Insufficient documentation

## 2015-07-13 DIAGNOSIS — Y998 Other external cause status: Secondary | ICD-10-CM | POA: Insufficient documentation

## 2015-07-13 DIAGNOSIS — Z87891 Personal history of nicotine dependence: Secondary | ICD-10-CM | POA: Insufficient documentation

## 2015-07-13 DIAGNOSIS — Z8719 Personal history of other diseases of the digestive system: Secondary | ICD-10-CM | POA: Diagnosis not present

## 2015-07-13 MED ORDER — DIPHENHYDRAMINE HCL 50 MG/ML IJ SOLN
INTRAMUSCULAR | Status: AC
  Administered 2015-07-13: 50 mg
  Filled 2015-07-13: qty 1

## 2015-07-13 MED ORDER — EPINEPHRINE 0.3 MG/0.3ML IJ SOAJ
INTRAMUSCULAR | Status: AC
  Administered 2015-07-13: 0.3 mg
  Filled 2015-07-13: qty 0.3

## 2015-07-13 MED ORDER — METHYLPREDNISOLONE SODIUM SUCC 125 MG IJ SOLR
INTRAMUSCULAR | Status: AC
  Administered 2015-07-13: 125 mg
  Filled 2015-07-13: qty 2

## 2015-07-13 MED ORDER — LORAZEPAM 2 MG/ML IJ SOLN
1.0000 mg | Freq: Once | INTRAMUSCULAR | Status: AC
  Administered 2015-07-13: 1 mg via INTRAVENOUS
  Filled 2015-07-13: qty 1

## 2015-07-13 MED ORDER — PREDNISONE 10 MG PO TABS: 20.0000 mg | ORAL_TABLET | Freq: Two times a day (BID) | ORAL | Status: AC

## 2015-07-13 NOTE — ED Notes (Signed)
PT experiencing anxiety after admin of epinephrine. Order requested.

## 2015-07-13 NOTE — ED Notes (Signed)
Skin started itching about an hour ago. She took Benadryl 50 mg and Ibuprofen 400 mg. Her lips started swelling after the medications. She is able to speak. Lips and eyes are swelling. Her skin is red and itching.

## 2015-07-13 NOTE — ED Notes (Signed)
PA AND RNs at bedside.

## 2015-07-13 NOTE — ED Notes (Signed)
MD at bedside. 

## 2015-07-13 NOTE — Discharge Instructions (Signed)
Prednisone as prescribed. ° °Benadryl 25 mg every 6 hours for the next 2 days. ° °Return to the ER if you develop difficulty breathing or swallowing, or other new and concerning symptoms. ° ° °Allergies °An allergy is an abnormal reaction to a substance by the body's defense system (immune system). Allergies can develop at any age. °WHAT CAUSES ALLERGIES? °An allergic reaction happens when the immune system mistakenly reacts to a normally harmless substance, called an allergen, as if it were harmful. The immune system releases antibodies to fight the substance. Antibodies eventually release a chemical called histamine into the bloodstream. The release of histamine is meant to protect the body from infection, but it also causes discomfort. °An allergic reaction can be triggered by: °· Eating an allergen. °· Inhaling an allergen. °· Touching an allergen. °WHAT TYPES OF ALLERGIES ARE THERE? °There are many types of allergies. Common types include: °· Seasonal allergies. People with this type of allergy are usually allergic to substances that are only present during certain seasons, such as molds and pollens. °· Food allergies. °· Drug allergies. °· Insect allergies. °· Animal dander allergies. °WHAT ARE SYMPTOMS OF ALLERGIES? °Possible allergy symptoms include: °· Swelling of the lips, face, tongue, mouth, or throat. °· Sneezing, coughing, or wheezing. °· Nasal congestion. °· Tingling in the mouth. °· Rash. °· Itching. °· Itchy, red, swollen areas of skin (hives). °· Watery eyes. °· Vomiting. °· Diarrhea. °· Dizziness. °· Lightheadedness. °· Fainting. °· Trouble breathing or swallowing. °· Chest tightness. °· Rapid heartbeat. °HOW ARE ALLERGIES DIAGNOSED? °Allergies are diagnosed with a medical and family history and one or more of the following: °· Skin tests. °· Blood tests. °· A food diary. A food diary is a record of all the foods and drinks you have in a day and of all the symptoms you experience. °· The results  of an elimination diet. An elimination diet involves eliminating foods from your diet and then adding them back in one by one to find out if a certain food causes an allergic reaction. °HOW ARE ALLERGIES TREATED? °There is no cure for allergies, but allergic reactions can be treated with medicine. Severe reactions usually need to be treated at a hospital. °HOW CAN REACTIONS BE PREVENTED? °The best way to prevent an allergic reaction is by avoiding the substance you are allergic to. Allergy shots and medicines can also help prevent reactions in some cases. People with severe allergic reactions may be able to prevent a life-threatening reaction called anaphylaxis with a medicine given right after exposure to the allergen. °  °This information is not intended to replace advice given to you by your health care provider. Make sure you discuss any questions you have with your health care provider. °  °Document Released: 08/23/2002 Document Revised: 06/20/2014 Document Reviewed: 03/11/2014 °Elsevier Interactive Patient Education ©2016 [employer] ° °

## 2015-07-13 NOTE — ED Notes (Signed)
Allergic Reaction to unknown source. Recalls having a gluten free burrito. Otherwise unknown.

## 2015-07-13 NOTE — ED Provider Notes (Signed)
CSN: [id number]     Arrival date & time 07/13/15  2130 History  By signing my name below, I, Tanda Rockers, attest that this documentation has been prepared under the direction and in the presence of Geoffery Lyons, MD. Electronically Signed: Tanda Rockers, ED Scribe. 07/13/2015. 10:01 PM.   Chief Complaint  Patient presents with  . Allergic Reaction    Patient is a 45 y.o. female presenting with allergic reaction. The history is provided by the patient. No language interpreter was used.  Allergic Reaction Presenting symptoms: itching and swelling   Presenting symptoms: no difficulty breathing and no difficulty swallowing   Severity:  Moderate Prior allergic episodes:  No prior episodes Context: food   Ineffective treatments:  Antihistamines    HPI Comments: Tracey Ward is a 45 y.o. female who presents to the Emergency Department complaining of an allergic reaction that occurred around 7:15 PM tonight (approximately 2 hours ago). The reaction began with swelling to bilateral ears and progressed to lip and bilateral eye swelling as well as a numbing sensation to the right side of her face. Pt is unsure what could have caused her reaction but states she ate some gluten free bread prior to the symptoms starting and she has never had gluten free bread in the past. She took 50 mg Benadryl and 400 mg Ibuprofen without relief, prompting her to come to the ED. Denies shortness of breath, tongue swelling, throat swelling, or any other associated symptoms.     Past Medical History  Diagnosis Date  . Asthma   . Depression   . ADD (attention deficit disorder)   . H/O miscarriage, not currently pregnant   . Hemorrhoids, internal    Past Surgical History  Procedure Laterality Date  . Foot surgery    . I&d extremity Right 02/21/2013    Procedure: IRRIGATION AND DEBRIDEMENT  RIGHT FOOT;  Surgeon: Toni Arthurs, MD;  Location: [facility] OR;  Service: Orthopedics;  Laterality: Right;   No family history on  file. Social History  Substance Use Topics  . Smoking status: Former Smoker -- 0.01 packs/day    Types: Cigarettes  . Smokeless tobacco: None  . Alcohol Use: No   OB History    No data available     Review of Systems  HENT: Negative for trouble swallowing.   Skin: Positive for itching.    A complete 10 system review of systems was obtained and all systems are negative except as noted in the HPI and PMH.   Allergies  Review of patient's allergies indicates no known allergies.  Home Medications   Prior to Admission medications   Medication Sig Start Date End Date Taking? Authorizing Provider  Fluconazole (DIFLUCAN PO) Take by mouth.   Yes Historical Provider, MD  Levothyroxine Sodium (SYNTHROID PO) Take by mouth.   Yes Historical Provider, MD  benzonatate (TESSALON) 200 MG capsule Take 1 capsule (200 mg total) by mouth 3 (three) times daily. 02/02/13   Lonia Blood, MD  cetirizine (ZYRTEC) 10 MG tablet Take 10 mg by mouth daily.    Historical Provider, MD  diazepam (VALIUM) 10 MG tablet Take 10 mg by mouth every 6 (six) hours as needed for anxiety.    Historical Provider, MD  diphenhydrAMINE (BENADRYL) 25 MG tablet Take 25 mg by mouth 2 (two) times daily as needed (congestion).    Historical Provider, MD  fluticasone (FLOVENT HFA) 44 MCG/ACT inhaler Inhale 2 puffs into the lungs 2 (two) times daily. 02/02/13  Lonia Blood, MD  guaiFENesin (MUCINEX) 600 MG 12 hr tablet Take 1,200 mg by mouth 2 (two) times daily as needed for congestion.    Historical Provider, MD  ibuprofen (ADVIL,MOTRIN) 200 MG tablet Take 800 mg by mouth 3 (three) times daily as needed for pain.    Historical Provider, MD  levalbuterol [facility] HFA) 45 MCG/ACT inhaler Inhale 1-2 puffs into the lungs every 6 (six) hours as needed for wheezing or shortness of breath. 02/02/13   Lonia Blood, MD  levalbuterol Pauline Aus) 1.25 MG/0.5ML nebulizer solution Take 0.63 mg by nebulization every 3 (three) hours as  needed for wheezing or shortness of breath. 02/02/13   Lonia Blood, MD  methylphenidate (RITALIN) 20 MG tablet Take 20 mg by mouth daily. 01/08/13   Historical Provider, MD  ondansetron (ZOFRAN ODT) 4 MG disintegrating tablet Take 1 tablet (4 mg total) by mouth every 8 (eight) hours as needed for nausea. 02/17/13   Heather Laisure, PA-C  oxyCODONE (OXY IR/ROXICODONE) 5 MG immediate release tablet Take 1 tablet (5 mg total) by mouth every 4 (four) hours as needed for pain. 02/25/13   Laveda Norman, MD  pantoprazole (PROTONIX) 40 MG tablet Take 1 tablet (40 mg total) by mouth 2 (two) times daily before a meal. 02/02/13   Lonia Blood, MD  Vilazodone HCl (VIIBRYD) 20 MG TABS Take 20 mg by mouth daily.    Historical Provider, MD   BP 124/90 mmHg  Pulse 82  Temp(Src) 98.3 F (36.8 C) (Oral)  Resp 18  Ht  (1.575 m)  Wt 186 lb (84.369 kg)  BMI 34.01 kg/m2  SpO2 96%   Physical Exam  Constitutional: She is oriented to person, place, and time. She appears well-developed and well-nourished. No distress.  HENT:  Head: Normocephalic and atraumatic.  Mouth/Throat: Oropharynx is clear and moist. No oropharyngeal exudate.  There is swelling and erythema to the eyelids, face, and lips.   Eyes: Conjunctivae and EOM are normal. Pupils are equal, round, and reactive to light.  Neck: Normal range of motion. Neck supple.  No meningismus.  Cardiovascular: Normal rate, regular rhythm, normal heart sounds and intact distal pulses.   No murmur heard. Pulmonary/Chest: Effort normal and breath sounds normal. No stridor. No respiratory distress. She has no wheezes. She has no rales.  Abdominal: Soft. There is no tenderness. There is no rebound and no guarding.  Musculoskeletal: Normal range of motion. She exhibits no edema or tenderness.  Neurological: She is alert and oriented to person, place, and time. No cranial nerve deficit. She exhibits normal muscle tone. Coordination normal.  No ataxia on finger  to nose bilaterally. No pronator drift. 5/5 strength throughout. CN 2-12 intact.Equal grip strength. Sensation intact.   Skin: Skin is warm. Rash noted.  There is a generalized urticarial rash present  Psychiatric: She has a normal mood and affect. Her behavior is normal.  Nursing note and vitals reviewed.   ED Course  Procedures (including critical care time)  DIAGNOSTIC STUDIES: Oxygen Saturation is 96% on RA, normal by my interpretation.    COORDINATION OF CARE: 9:58 PM-Discussed treatment plan which includes observation after epipen with pt at bedside and pt agreed to plan.   Labs Review Labs Reviewed - No data to display  Imaging Review No results found.    MDM   Final diagnoses:  None    Patient presents here with complaints of facial swelling and redness started after eating a burrito. She reports food  intolerances, however never experienced anything like this. She was given epi, steroids, and Benadryl and has made a marked improvement. Her facial swelling has gone down and she is feeling much better. She will be discharged with prednisone and Benadryl for the next 2 days along with when necessary return if she worsens.   I personally performed the services described in this documentation, which was scribed in my presence. The recorded information has been reviewed and is accurate.       Geoffery Lyons, MD 07/13/15 2256
